# Patient Record
Sex: Female | Born: 1945 | Race: White | Hispanic: No | Marital: Married | Smoking: Former smoker
Health system: Southern US, Community
[De-identification: ages and names within clinical notes are randomized; demographics above are authoritative.]

## PROBLEM LIST (undated history)

## (undated) DIAGNOSIS — F0390 Unspecified dementia without behavioral disturbance: Secondary | ICD-10-CM

## (undated) DIAGNOSIS — E119 Type 2 diabetes mellitus without complications: Secondary | ICD-10-CM

## (undated) DIAGNOSIS — N3000 Acute cystitis without hematuria: Secondary | ICD-10-CM

## (undated) DIAGNOSIS — E785 Hyperlipidemia, unspecified: Secondary | ICD-10-CM

## (undated) DIAGNOSIS — J90 Pleural effusion, not elsewhere classified: Secondary | ICD-10-CM

## (undated) DIAGNOSIS — M810 Age-related osteoporosis without current pathological fracture: Secondary | ICD-10-CM

## (undated) DIAGNOSIS — R0603 Acute respiratory distress: Secondary | ICD-10-CM

## (undated) DIAGNOSIS — I10 Essential (primary) hypertension: Secondary | ICD-10-CM

## (undated) DIAGNOSIS — J9601 Acute respiratory failure with hypoxia: Secondary | ICD-10-CM

## (undated) HISTORY — PX: ABDOMINAL HYSTERECTOMY: SHX81

## (undated) HISTORY — DX: Acute respiratory failure with hypoxia: J96.01

## (undated) HISTORY — DX: Acute cystitis without hematuria: N30.00

## (undated) HISTORY — DX: Acute respiratory distress: R06.03

## (undated) HISTORY — DX: Pleural effusion, not elsewhere classified: J90

---

## 2012-07-11 DIAGNOSIS — R609 Edema, unspecified: Secondary | ICD-10-CM

## 2012-07-11 DIAGNOSIS — E876 Hypokalemia: Secondary | ICD-10-CM

## 2012-07-11 DIAGNOSIS — I10 Essential (primary) hypertension: Secondary | ICD-10-CM

## 2012-07-11 DIAGNOSIS — Z90710 Acquired absence of both cervix and uterus: Secondary | ICD-10-CM

## 2012-07-11 DIAGNOSIS — R519 Headache, unspecified: Secondary | ICD-10-CM

## 2012-07-11 DIAGNOSIS — R32 Unspecified urinary incontinence: Secondary | ICD-10-CM

## 2012-07-11 DIAGNOSIS — E785 Hyperlipidemia, unspecified: Secondary | ICD-10-CM

## 2012-07-11 DIAGNOSIS — K219 Gastro-esophageal reflux disease without esophagitis: Secondary | ICD-10-CM

## 2012-07-11 DIAGNOSIS — F419 Anxiety disorder, unspecified: Secondary | ICD-10-CM

## 2012-07-11 HISTORY — DX: Hyperlipidemia, unspecified: E78.5

## 2012-07-11 HISTORY — DX: Acquired absence of both cervix and uterus: Z90.710

## 2012-07-11 HISTORY — DX: Headache, unspecified: R51.9

## 2012-07-11 HISTORY — DX: Hypokalemia: E87.6

## 2012-07-11 HISTORY — DX: Anxiety disorder, unspecified: F41.9

## 2012-07-11 HISTORY — DX: Edema, unspecified: R60.9

## 2012-07-11 HISTORY — DX: Gastro-esophageal reflux disease without esophagitis: K21.9

## 2012-07-11 HISTORY — DX: Unspecified urinary incontinence: R32

## 2012-07-11 HISTORY — DX: Essential (primary) hypertension: I10

## 2013-03-03 DIAGNOSIS — R634 Abnormal weight loss: Secondary | ICD-10-CM

## 2013-03-03 HISTORY — DX: Abnormal weight loss: R63.4

## 2014-02-25 DIAGNOSIS — M549 Dorsalgia, unspecified: Secondary | ICD-10-CM

## 2014-02-25 HISTORY — DX: Dorsalgia, unspecified: M54.9

## 2015-03-25 DIAGNOSIS — Z79899 Other long term (current) drug therapy: Secondary | ICD-10-CM

## 2015-03-25 HISTORY — DX: Other long term (current) drug therapy: Z79.899

## 2016-02-23 DIAGNOSIS — G2581 Restless legs syndrome: Secondary | ICD-10-CM | POA: Insufficient documentation

## 2016-02-23 HISTORY — DX: Restless legs syndrome: G25.81

## 2016-04-18 DIAGNOSIS — E119 Type 2 diabetes mellitus without complications: Secondary | ICD-10-CM

## 2016-04-18 HISTORY — DX: Type 2 diabetes mellitus without complications: E11.9

## 2019-09-25 DIAGNOSIS — N390 Urinary tract infection, site not specified: Secondary | ICD-10-CM | POA: Diagnosis not present

## 2019-09-25 DIAGNOSIS — N179 Acute kidney failure, unspecified: Secondary | ICD-10-CM | POA: Diagnosis not present

## 2019-09-26 DIAGNOSIS — N179 Acute kidney failure, unspecified: Secondary | ICD-10-CM | POA: Diagnosis not present

## 2019-09-26 DIAGNOSIS — N390 Urinary tract infection, site not specified: Secondary | ICD-10-CM | POA: Diagnosis not present

## 2019-09-26 DIAGNOSIS — I340 Nonrheumatic mitral (valve) insufficiency: Secondary | ICD-10-CM

## 2019-09-26 DIAGNOSIS — I361 Nonrheumatic tricuspid (valve) insufficiency: Secondary | ICD-10-CM

## 2019-09-27 DIAGNOSIS — N390 Urinary tract infection, site not specified: Secondary | ICD-10-CM | POA: Diagnosis not present

## 2019-09-27 DIAGNOSIS — N179 Acute kidney failure, unspecified: Secondary | ICD-10-CM | POA: Diagnosis not present

## 2019-09-28 ENCOUNTER — Encounter (HOSPITAL_COMMUNITY): Payer: Self-pay | Admitting: Physician Assistant

## 2019-09-28 ENCOUNTER — Inpatient Hospital Stay (HOSPITAL_COMMUNITY): Payer: Medicare Other

## 2019-09-28 ENCOUNTER — Inpatient Hospital Stay (HOSPITAL_COMMUNITY)
Admission: AD | Admit: 2019-09-28 | Discharge: 2019-10-06 | DRG: 682 | Disposition: A | Discharge status: Home Health | Payer: Medicare Other | Source: Other Acute Inpatient Hospital | Attending: Internal Medicine | Admitting: Internal Medicine

## 2019-09-28 ENCOUNTER — Other Ambulatory Visit: Payer: Self-pay

## 2019-09-28 DIAGNOSIS — E785 Hyperlipidemia, unspecified: Secondary | ICD-10-CM | POA: Diagnosis present

## 2019-09-28 DIAGNOSIS — I471 Supraventricular tachycardia: Secondary | ICD-10-CM | POA: Diagnosis not present

## 2019-09-28 DIAGNOSIS — M810 Age-related osteoporosis without current pathological fracture: Secondary | ICD-10-CM | POA: Diagnosis present

## 2019-09-28 DIAGNOSIS — G20 Parkinson's disease: Secondary | ICD-10-CM | POA: Diagnosis not present

## 2019-09-28 DIAGNOSIS — R0600 Dyspnea, unspecified: Secondary | ICD-10-CM

## 2019-09-28 DIAGNOSIS — I4891 Unspecified atrial fibrillation: Secondary | ICD-10-CM | POA: Diagnosis not present

## 2019-09-28 DIAGNOSIS — Z8041 Family history of malignant neoplasm of ovary: Secondary | ICD-10-CM

## 2019-09-28 DIAGNOSIS — I10 Essential (primary) hypertension: Secondary | ICD-10-CM | POA: Diagnosis present

## 2019-09-28 DIAGNOSIS — S42034A Nondisplaced fracture of lateral end of right clavicle, initial encounter for closed fracture: Secondary | ICD-10-CM | POA: Diagnosis present

## 2019-09-28 DIAGNOSIS — Z23 Encounter for immunization: Secondary | ICD-10-CM

## 2019-09-28 DIAGNOSIS — N179 Acute kidney failure, unspecified: Secondary | ICD-10-CM

## 2019-09-28 DIAGNOSIS — J90 Pleural effusion, not elsewhere classified: Secondary | ICD-10-CM

## 2019-09-28 DIAGNOSIS — Z8349 Family history of other endocrine, nutritional and metabolic diseases: Secondary | ICD-10-CM

## 2019-09-28 DIAGNOSIS — E860 Dehydration: Secondary | ICD-10-CM | POA: Diagnosis present

## 2019-09-28 DIAGNOSIS — D638 Anemia in other chronic diseases classified elsewhere: Secondary | ICD-10-CM | POA: Diagnosis present

## 2019-09-28 DIAGNOSIS — J918 Pleural effusion in other conditions classified elsewhere: Secondary | ICD-10-CM | POA: Diagnosis present

## 2019-09-28 DIAGNOSIS — F0390 Unspecified dementia without behavioral disturbance: Secondary | ICD-10-CM | POA: Diagnosis present

## 2019-09-28 DIAGNOSIS — E8779 Other fluid overload: Secondary | ICD-10-CM | POA: Diagnosis present

## 2019-09-28 DIAGNOSIS — N3000 Acute cystitis without hematuria: Secondary | ICD-10-CM

## 2019-09-28 DIAGNOSIS — F329 Major depressive disorder, single episode, unspecified: Secondary | ICD-10-CM | POA: Diagnosis present

## 2019-09-28 DIAGNOSIS — Z87891 Personal history of nicotine dependence: Secondary | ICD-10-CM

## 2019-09-28 DIAGNOSIS — T368X5A Adverse effect of other systemic antibiotics, initial encounter: Secondary | ICD-10-CM | POA: Diagnosis present

## 2019-09-28 DIAGNOSIS — T464X5A Adverse effect of angiotensin-converting-enzyme inhibitors, initial encounter: Secondary | ICD-10-CM | POA: Diagnosis present

## 2019-09-28 DIAGNOSIS — Z79899 Other long term (current) drug therapy: Secondary | ICD-10-CM

## 2019-09-28 DIAGNOSIS — E1165 Type 2 diabetes mellitus with hyperglycemia: Secondary | ICD-10-CM | POA: Diagnosis present

## 2019-09-28 DIAGNOSIS — E119 Type 2 diabetes mellitus without complications: Secondary | ICD-10-CM | POA: Diagnosis not present

## 2019-09-28 DIAGNOSIS — Z7901 Long term (current) use of anticoagulants: Secondary | ICD-10-CM | POA: Diagnosis not present

## 2019-09-28 DIAGNOSIS — E876 Hypokalemia: Secondary | ICD-10-CM | POA: Diagnosis present

## 2019-09-28 DIAGNOSIS — W19XXXA Unspecified fall, initial encounter: Secondary | ICD-10-CM | POA: Diagnosis present

## 2019-09-28 DIAGNOSIS — I480 Paroxysmal atrial fibrillation: Secondary | ICD-10-CM | POA: Diagnosis present

## 2019-09-28 DIAGNOSIS — F0281 Dementia in other diseases classified elsewhere with behavioral disturbance: Secondary | ICD-10-CM | POA: Diagnosis not present

## 2019-09-28 DIAGNOSIS — R0603 Acute respiratory distress: Secondary | ICD-10-CM

## 2019-09-28 DIAGNOSIS — Z801 Family history of malignant neoplasm of trachea, bronchus and lung: Secondary | ICD-10-CM

## 2019-09-28 DIAGNOSIS — R197 Diarrhea, unspecified: Secondary | ICD-10-CM | POA: Diagnosis present

## 2019-09-28 DIAGNOSIS — Z806 Family history of leukemia: Secondary | ICD-10-CM

## 2019-09-28 DIAGNOSIS — N170 Acute kidney failure with tubular necrosis: Secondary | ICD-10-CM | POA: Diagnosis present

## 2019-09-28 DIAGNOSIS — Z820 Family history of epilepsy and other diseases of the nervous system: Secondary | ICD-10-CM

## 2019-09-28 DIAGNOSIS — J9601 Acute respiratory failure with hypoxia: Secondary | ICD-10-CM | POA: Diagnosis present

## 2019-09-28 HISTORY — DX: Unspecified dementia, unspecified severity, without behavioral disturbance, psychotic disturbance, mood disturbance, and anxiety: F03.90

## 2019-09-28 HISTORY — DX: Essential (primary) hypertension: I10

## 2019-09-28 HISTORY — DX: Age-related osteoporosis without current pathological fracture: M81.0

## 2019-09-28 HISTORY — DX: Hyperlipidemia, unspecified: E78.5

## 2019-09-28 HISTORY — DX: Type 2 diabetes mellitus without complications: E11.9

## 2019-09-28 HISTORY — DX: Acute kidney failure, unspecified: N17.9

## 2019-09-28 LAB — CBC WITH DIFFERENTIAL/PLATELET
Abs Immature Granulocytes: 0.09 10*3/uL — ABNORMAL HIGH (ref 0.00–0.07)
Basophils Absolute: 0 10*3/uL (ref 0.0–0.1)
Basophils Relative: 0 %
Eosinophils Absolute: 0.2 10*3/uL (ref 0.0–0.5)
Eosinophils Relative: 1 %
HCT: 39.7 % (ref 36.0–46.0)
Hemoglobin: 12.7 g/dL (ref 12.0–15.0)
Immature Granulocytes: 1 %
Lymphocytes Relative: 7 %
Lymphs Abs: 1 10*3/uL (ref 0.7–4.0)
MCH: 31.1 pg (ref 26.0–34.0)
MCHC: 32 g/dL (ref 30.0–36.0)
MCV: 97.3 fL (ref 80.0–100.0)
Monocytes Absolute: 1.1 10*3/uL — ABNORMAL HIGH (ref 0.1–1.0)
Monocytes Relative: 7 %
Neutro Abs: 12.9 10*3/uL — ABNORMAL HIGH (ref 1.7–7.7)
Neutrophils Relative %: 84 %
Platelets: 186 10*3/uL (ref 150–400)
RBC: 4.08 MIL/uL (ref 3.87–5.11)
RDW: 15.9 % — ABNORMAL HIGH (ref 11.5–15.5)
WBC: 15.3 10*3/uL — ABNORMAL HIGH (ref 4.0–10.5)
nRBC: 0 % (ref 0.0–0.2)

## 2019-09-28 LAB — COMPREHENSIVE METABOLIC PANEL
ALT: 16 U/L (ref 0–44)
AST: 19 U/L (ref 15–41)
Albumin: 2.2 g/dL — ABNORMAL LOW (ref 3.5–5.0)
Alkaline Phosphatase: 75 U/L (ref 38–126)
Anion gap: 13 (ref 5–15)
BUN: 42 mg/dL — ABNORMAL HIGH (ref 8–23)
CO2: 23 mmol/L (ref 22–32)
Calcium: 7.8 mg/dL — ABNORMAL LOW (ref 8.9–10.3)
Chloride: 105 mmol/L (ref 98–111)
Creatinine, Ser: 4.85 mg/dL — ABNORMAL HIGH (ref 0.44–1.00)
GFR calc Af Amer: 10 mL/min — ABNORMAL LOW (ref >60)
GFR calc non Af Amer: 8 mL/min — ABNORMAL LOW (ref >60)
Glucose, Bld: 148 mg/dL — ABNORMAL HIGH (ref 70–99)
Potassium: 3.4 mmol/L — ABNORMAL LOW (ref 3.5–5.1)
Sodium: 141 mmol/L (ref 135–145)
Total Bilirubin: 0.5 mg/dL (ref 0.3–1.2)
Total Protein: 5 g/dL — ABNORMAL LOW (ref 6.5–8.1)

## 2019-09-28 LAB — POCT I-STAT 7, (LYTES, BLD GAS, ICA,H+H)
Acid-base deficit: 2 mmol/L (ref 0.0–2.0)
Bicarbonate: 23.8 mmol/L (ref 20.0–28.0)
Calcium, Ion: 1.12 mmol/L — ABNORMAL LOW (ref 1.15–1.40)
HCT: 35 % — ABNORMAL LOW (ref 36.0–46.0)
Hemoglobin: 11.9 g/dL — ABNORMAL LOW (ref 12.0–15.0)
O2 Saturation: 98 %
Patient temperature: 99.7
Potassium: 3.2 mmol/L — ABNORMAL LOW (ref 3.5–5.1)
Sodium: 140 mmol/L (ref 135–145)
TCO2: 25 mmol/L (ref 22–32)
pCO2 arterial: 47.5 mmHg (ref 32.0–48.0)
pH, Arterial: 7.311 — ABNORMAL LOW (ref 7.350–7.450)
pO2, Arterial: 128 mmHg — ABNORMAL HIGH (ref 83.0–108.0)

## 2019-09-28 LAB — RESPIRATORY PANEL BY PCR

## 2019-09-28 LAB — URINALYSIS, ROUTINE W REFLEX MICROSCOPIC
Bacteria, UA: NONE SEEN
Bilirubin Urine: NEGATIVE
Glucose, UA: NEGATIVE mg/dL
Ketones, ur: NEGATIVE mg/dL
Leukocytes,Ua: NEGATIVE
Nitrite: NEGATIVE
Protein, ur: NEGATIVE mg/dL
Specific Gravity, Urine: 1.006 (ref 1.005–1.030)
pH: 5 (ref 5.0–8.0)

## 2019-09-28 LAB — HEMOGLOBIN A1C
Hgb A1c MFr Bld: 5.8 % — ABNORMAL HIGH (ref 4.8–5.6)
Mean Plasma Glucose: 119.76 mg/dL

## 2019-09-28 LAB — GLUCOSE, CAPILLARY
Glucose-Capillary: 129 mg/dL — ABNORMAL HIGH (ref 70–99)
Glucose-Capillary: 180 mg/dL — ABNORMAL HIGH (ref 70–99)
Glucose-Capillary: 258 mg/dL — ABNORMAL HIGH (ref 70–99)
Glucose-Capillary: 211 mg/dL — ABNORMAL HIGH (ref 70–99)
Glucose-Capillary: 196 mg/dL — ABNORMAL HIGH (ref 70–99)

## 2019-09-28 LAB — LACTIC ACID, PLASMA
Lactic Acid, Venous: 0.9 mmol/L (ref 0.5–1.9)
Lactic Acid, Venous: 0.9 mmol/L (ref 0.5–1.9)

## 2019-09-28 LAB — SODIUM, URINE, RANDOM: Sodium, Ur: 114 mmol/L

## 2019-09-28 LAB — PROCALCITONIN: Procalcitonin: 0.32 ng/mL

## 2019-09-28 LAB — PHOSPHORUS: Phosphorus: 4.1 mg/dL (ref 2.5–4.6)

## 2019-09-28 LAB — MRSA PCR SCREENING: MRSA by PCR: NEGATIVE

## 2019-09-28 LAB — MAGNESIUM: Magnesium: 1.7 mg/dL (ref 1.7–2.4)

## 2019-09-28 LAB — CREATININE, URINE, RANDOM: Creatinine, Urine: 23.9 mg/dL

## 2019-09-28 MED ORDER — INSULIN ASPART 100 UNIT/ML ~~LOC~~ SOLN
0.0000 [IU] | Freq: Three times a day (TID) | SUBCUTANEOUS | Status: DC
  Administered 2019-09-28: 3 [IU] via SUBCUTANEOUS
  Administered 2019-09-28: 13:00:00 5 [IU] via SUBCUTANEOUS
  Administered 2019-09-29 (×3): 3 [IU] via SUBCUTANEOUS
  Administered 2019-09-30: 2 [IU] via SUBCUTANEOUS
  Administered 2019-09-30: 3 [IU] via SUBCUTANEOUS
  Administered 2019-09-30 – 2019-10-01 (×4): 2 [IU] via SUBCUTANEOUS
  Administered 2019-10-02: 1 [IU] via SUBCUTANEOUS
  Administered 2019-10-02 (×2): 2 [IU] via SUBCUTANEOUS
  Administered 2019-10-03: 3 [IU] via SUBCUTANEOUS
  Administered 2019-10-03: 2 [IU] via SUBCUTANEOUS
  Administered 2019-10-03: 3 [IU] via SUBCUTANEOUS
  Administered 2019-10-04: 5 [IU] via SUBCUTANEOUS
  Administered 2019-10-04: 2 [IU] via SUBCUTANEOUS
  Administered 2019-10-04: 3 [IU] via SUBCUTANEOUS
  Administered 2019-10-05 (×3): 2 [IU] via SUBCUTANEOUS
  Administered 2019-10-06: 3 [IU] via SUBCUTANEOUS

## 2019-09-28 MED ORDER — INFLUENZA VAC A&B SA ADJ QUAD 0.5 ML IM PRSY
0.5000 mL | PREFILLED_SYRINGE | INTRAMUSCULAR | Status: AC
  Administered 2019-09-29: 0.5 mL via INTRAMUSCULAR
  Filled 2019-09-28: qty 0.5

## 2019-09-28 MED ORDER — PAROXETINE HCL 20 MG PO TABS
20.0000 mg | ORAL_TABLET | Freq: Every day | ORAL | Status: DC
  Administered 2019-09-28 – 2019-10-06 (×9): 20 mg via ORAL
  Filled 2019-09-28 (×9): qty 1

## 2019-09-28 MED ORDER — INSULIN ASPART 100 UNIT/ML ~~LOC~~ SOLN
0.0000 [IU] | Freq: Every day | SUBCUTANEOUS | Status: DC
  Administered 2019-10-04: 23:00:00 2 [IU] via SUBCUTANEOUS

## 2019-09-28 MED ORDER — SODIUM CHLORIDE 0.9 % IV SOLN: 2.0000 g | INTRAVENOUS | Status: DC

## 2019-09-28 MED ORDER — MAGNESIUM SULFATE IN D5W 1-5 GM/100ML-% IV SOLN
1.0000 g | Freq: Once | INTRAVENOUS | Status: AC
  Administered 2019-09-28: 1 g via INTRAVENOUS
  Filled 2019-09-28: qty 100

## 2019-09-28 MED ORDER — HEPARIN SODIUM (PORCINE) 5000 UNIT/ML IJ SOLN
5000.0000 [IU] | Freq: Three times a day (TID) | INTRAMUSCULAR | Status: DC
  Administered 2019-09-28 – 2019-09-30 (×6): 5000 [IU] via SUBCUTANEOUS
  Filled 2019-09-28 (×6): qty 1

## 2019-09-28 MED ORDER — AMIODARONE HCL IN DEXTROSE 360-4.14 MG/200ML-% IV SOLN
60.0000 mg/h | INTRAVENOUS | Status: AC
  Administered 2019-09-28 (×2): 60 mg/h via INTRAVENOUS

## 2019-09-28 MED ORDER — SODIUM CHLORIDE 0.9 % IV SOLN
1.0000 g | INTRAVENOUS | Status: DC
  Filled 2019-09-28: qty 10

## 2019-09-28 MED ORDER — STERILE WATER FOR INJECTION IV SOLN
INTRAVENOUS | Status: DC
  Administered 2019-09-28: 06:00:00 via INTRAVENOUS
  Filled 2019-09-28: qty 850

## 2019-09-28 MED ORDER — AMIODARONE LOAD VIA INFUSION
150.0000 mg | Freq: Once | INTRAVENOUS | Status: AC
  Administered 2019-09-28: 11:00:00 150 mg via INTRAVENOUS
  Filled 2019-09-28: qty 83.34

## 2019-09-28 MED ORDER — AMIODARONE HCL IN DEXTROSE 360-4.14 MG/200ML-% IV SOLN
30.0000 mg/h | INTRAVENOUS | Status: DC
  Administered 2019-09-28 – 2019-09-29 (×2): 30 mg/h via INTRAVENOUS
  Filled 2019-09-28 (×4): qty 200

## 2019-09-28 MED ORDER — POTASSIUM CHLORIDE 20 MEQ PO PACK
40.0000 meq | PACK | Freq: Once | ORAL | Status: AC
  Administered 2019-09-28: 40 meq via ORAL
  Filled 2019-09-28: qty 2

## 2019-09-28 MED ORDER — FUROSEMIDE 10 MG/ML IJ SOLN
100.0000 mg | Freq: Once | INTRAVENOUS | Status: AC
  Administered 2019-09-28: 100 mg via INTRAVENOUS
  Filled 2019-09-28: qty 10

## 2019-09-28 MED ORDER — CHLORHEXIDINE GLUCONATE CLOTH 2 % EX PADS
6.0000 | MEDICATED_PAD | Freq: Every day | CUTANEOUS | Status: DC
  Administered 2019-09-28 – 2019-10-06 (×8): 6 via TOPICAL

## 2019-09-28 MED ORDER — CLONAZEPAM 0.5 MG PO TABS
2.0000 mg | ORAL_TABLET | Freq: Every day | ORAL | Status: DC | PRN
  Administered 2019-10-03 – 2019-10-05 (×2): 2 mg via ORAL
  Filled 2019-09-28 (×2): qty 4

## 2019-09-28 MED ORDER — VANCOMYCIN HCL 10 G IV SOLR
1250.0000 mg | Freq: Once | INTRAVENOUS | Status: AC
  Administered 2019-09-28: 06:00:00 1250 mg via INTRAVENOUS
  Filled 2019-09-28: qty 1250

## 2019-09-28 MED ORDER — VANCOMYCIN VARIABLE DOSE PER UNSTABLE RENAL FUNCTION (PHARMACIST DOSING): Status: DC

## 2019-09-28 MED ORDER — METOPROLOL TARTRATE 5 MG/5ML IV SOLN
5.0000 mg | Freq: Four times a day (QID) | INTRAVENOUS | Status: DC
  Administered 2019-09-28 – 2019-09-29 (×4): 5 mg via INTRAVENOUS
  Filled 2019-09-28 (×4): qty 5

## 2019-09-28 NOTE — Consult Note (Signed)
Renal Service Consult Note [facility]  Haifa Stigler 09/28/2019 Sol Blazing Requesting Physician:  Dr Lake Bells  Reason for Consult:  AKI HPI: The patient is a 73 y.o. year-old with hx of HTN, HL, DM2, dementia who was dx'd with UTI and started on Bactrim on 11/9, then became fatigued, malaise, gen weakness, poor po intake over the next few days. She presented to [facility] on 11/12 where labs showed AKI w/ creat 4.0 (baseline <1.0).  Pt was admitted and started on Zosyn . Then had increasing O2 demand w/ L pleural effusion. Thoracentesis returned 650 cc. V/Q scan was low prob, echo showed EF 60-65%, noncon CT of  abd/ pelvis was negative reportedly for renal obstruction.  O2 demand worsened to 10L [place] and pt was tx'd to  [facility] ICU today. Since arrival is feeling better and has voided 2.4 L w/ foley cath in place today since arriving at 1 am. Asked to see for renal failure.     Patient is very poor historian, hx gained from the chart.  Pt denies CP, abd pain, n/v/d.  No sig SOB or coughing.    ROS  denies CP  no joint pain   no HA  no blurry vision  no rash  no diarrhea  no nausea/ vomiting   Past Medical History  Past Medical History:  Diagnosis Date  . Dementia ([place])   . Diabetes ([place])   . Hyperlipidemia   . Hypertension   . Osteoporosis    Past Surgical History History reviewed. No pertinent surgical history. Family History No family history on file. Social History  has no history on file for tobacco, alcohol, and drug. Allergies  Allergies  Allergen Reactions  . Metformin And Related Diarrhea   Home medications Prior to Admission medications   Medication Sig Start Date End Date Taking? Authorizing Provider  amLODipine (NORVASC) 10 MG tablet Take 10 mg by mouth daily. 10/25/15   [provider]  clonazePAM (KLONOPIN) 2 MG tablet Take 2 mg by mouth.  10/19/15   [provider]  glipiZIDE (GLUCOTROL) 5 MG tablet Take 5 mg by mouth 2 (two)  times daily. 08/13/19 08/13/21  [provider]  hydrochlorothiazide (HYDRODIURIL) 25 MG tablet Take 25 mg by mouth.  10/25/15   [provider]  lisinopril (ZESTRIL) 40 MG tablet Take 40 mg by mouth daily. 10/25/15   [provider]  lovastatin (MEVACOR) 20 MG tablet Take 20 mg by mouth daily. 10/25/15   [provider]  metoprolol tartrate (LOPRESSOR) 100 MG tablet Take 100 mg by mouth.  06/30/14   [provider]  oxybutynin (DITROPAN-XL) 10 MG 24 hr tablet Take 10 mg by mouth daily.  09/19/19   [provider]  PARoxetine (PAXIL) 20 MG tablet Take 20 mg by mouth daily. 10/25/15   [provider]  phenazopyridine (PYRIDIUM) 200 MG tablet Take 200 mg by mouth 3 (three) times daily. 09/22/19 09/21/20  [provider]  pioglitazone (ACTOS) 30 MG tablet Take 30 mg by mouth daily. 08/20/19   [provider]  potassium chloride (KLOR-CON) 10 MEQ tablet Take 10 mEq by mouth daily. 09/08/19   [provider]  sulfamethoxazole-trimethoprim (BACTRIM DS) 800-160 MG tablet Take 1 tablet by mouth 2 (two) times daily. 09/22/19   [provider]  venlafaxine XR (EFFEXOR-XR) 75 MG 24 hr capsule Take 75 mg by mouth daily. 02/10/19   [provider]   Liver Function Tests Recent Labs  Lab 09/28/19 [id number]  AST 19  ALT 16  ALKPHOS 75  BILITOT 0.5  PROT 5.0*  ALBUMIN 2.2*   No results for input(s): LIPASE, AMYLASE in the last 168 hours. CBC Recent Labs  Lab 09/28/19 0335 09/28/19 0356  WBC  --  15.3*  NEUTROABS  --  12.9*  HGB 11.9* 12.7  HCT 35.0* 39.7  MCV  --  97.3  PLT  --  99991111   Basic Metabolic Panel Recent Labs  Lab 09/28/19 0335 09/28/19 0356  NA 140 141  K 3.2* 3.4*  CL  --  105  CO2  --  23  GLUCOSE  --  148*  BUN  --  42*  CREATININE  --  4.85*  CALCIUM  --  7.8*  PHOS  --  4.1   Iron/TIBC/Ferritin/ %Sat No results found for: IRON, TIBC, FERRITIN, IRONPCTSAT  Vitals:    09/28/19 1630 09/28/19 1700 09/28/19 1730 09/28/19 1800  BP: (!) 107/51 (!) 114/48 (!) 135/49 (!) 131/55  Pulse: 77 68 80 75  Resp: 19 18 19 19   Temp:      TempSrc:      SpO2: 90% 91% 90% 91%  Weight:      Height:        Exam Gen elderly adult female, HOH, no distress, calm , Ox 3 No rash, cyanosis or gangrene Sclera anicteric, throat clear  No jvd or bruits, JVP about 8-10 cm Chest clear on R, L base w/ bronchial BS RRR no MRG Abd soft ntnd no mass or ascites +bs no hsm GU foley in place draining clear yellow urine MS no joint effusions or deformity Ext doughy 1-2 mild dependent lateral hip edema, no lower leg edema or UE edema Neuro is alert, Ox 3 , nf, no asterixis    Home meds:  - amlodipine 10/ hydrochlorothiazide 25 qd/ lisinopril 40 qd/ metoprolol 100 qd /Kdur 10 qd  - pioglitazone 30 qd/ glipizide 5 bid  - clonazepam 2mg  prn/ paroxetine 20 qd/ venlafaxine xr 75 qd  - bactrim DS bid  - lovastatin 20 qd  - prn's/ vitamins/ supplements    Today UA here is negative, UNa 114 and UCr 23.9   Na 141  K 3.4  CO2 323  BUN 42  Creat 4.85.      Renal US 11/15 > 11 cm kidney's w/o hydro, normal echo, 9 mm echogenic lesion mid-lower right c/w prev idenitified small AML    CXR 11/15 > large L effusion, vasc congestion, mild CM  Assessment/ Plan: 1. AKI - in patient who was getting Bactrim for UTI while on lisinopril for HTN, presented on 11/12 w/ creat 4.0 to outside hospital. Question vol overload as well, so sent to [facility] on 11/15 where creat remains about 4.0.  Here the pt had normal BP's, moderate edema LE's, L effusion by exam/ CXR, not in distress. Got IV lasix 100 cc and diuresed 2.4 L so far in 18 hours.  AKI due to acei/ dehydration/ bactrim causing ATN most likely.  UA w/o sig proteinuria or hematuria to suggest glomerular disease. Vol overload sig but not severe, pt diuresed very well here since arrival w/ one dose of IV lasix 100mg . Will hold further lasix for tonight and  reassess creat in am, redose as needed.   2. Volume overload - L effusion, LE edema, as above. ECHO at outside showed normal LVEF.  3. Atrial fib - on IV amiodarone, in and out of afib here 4. Recent UTI - UA here  negative 5. IDDM 6. Dementia - seems pretty oriented here 7. Depression 8. HL      Kelly Splinter  MD 09/28/2019, 6:31 PM

## 2019-09-28 NOTE — Progress Notes (Signed)
Pharmacy Antibiotic Note  Summer Goodman is a 73 y.o. female admitted on 09/28/2019 with pneumonia and sepsis.  Pharmacy has been consulted for vancomycin and cefepime dosing.  Pt had initially been treated with Bactrim as an outpatient for UTI, changed to [place] after admission to [facility]; Zosyn was still running when pt arrived to [facility] ICU.  Pt w/ AKI, baseline SCr 0.8, currently 5.6.  Labs from OSH: WBC 13.2, Hgb, 12.4, Plt 180, pH 7.29  Home meds: Norvasc, Klonopin, Ascomp w/ codeine, enalapril, HCTZ, lisinopril, Miralax, lovastatin, Lopressor, Zofran, KCl  Plan: Vancomycin 1250mg  IV x1; monitor renal function prior to redosing. Cefepime 2gIV Q24H.  Height: 5' (152.4 cm) Weight: 151 lb 10.8 oz (68.8 kg) IBW/kg (Calculated) : 45.5  Temp (24hrs), Avg:99.1 F (37.3 C), Min:98.7 F (37.1 C), Max:99.5 F (37.5 C)   Allergies  Allergen Reactions  . Metformin And Related Diarrhea    Thank you for allowing pharmacy to be a part of this patient's care.  Wynona Neat, PharmD, BCPS  09/28/2019 4:31 AM

## 2019-09-28 NOTE — Progress Notes (Signed)
  Amiodarone Drug - Drug Interaction Consult Note  Recommendations: Continue current therapy  Amiodarone is metabolized by the cytochrome P450 system and therefore has the potential to cause many drug interactions. Amiodarone has an average plasma half-life of 50 days (range 20 to 100 days).   There is potential for drug interactions to occur several weeks or months after stopping treatment and the onset of drug interactions may be slow after initiating amiodarone.   []  Statins: Increased risk of myopathy. Simvastatin- restrict dose to 20mg  daily. Other statins: counsel patients to report any muscle pain or weakness immediately.  []  Anticoagulants: Amiodarone can increase anticoagulant effect. Consider warfarin dose reduction. Patients should be monitored closely and the dose of anticoagulant altered accordingly, remembering that amiodarone levels take several weeks to stabilize.  []  Antiepileptics: Amiodarone can increase plasma concentration of phenytoin, the dose should be reduced. Note that small changes in phenytoin dose can result in large changes in levels. Monitor patient and counsel on signs of toxicity.  [x]  Beta blockers: increased risk of bradycardia, AV block and myocardial depression. Sotalol - avoid concomitant use.  []   Calcium channel blockers (diltiazem and verapamil): increased risk of bradycardia, AV block and myocardial depression.  []   Cyclosporine: Amiodarone increases levels of cyclosporine. Reduced dose of cyclosporine is recommended.  []  Digoxin dose should be halved when amiodarone is started.  []  Diuretics: increased risk of cardiotoxicity if hypokalemia occurs.  []  Oral hypoglycemic agents (glyburide, glipizide, glimepiride): increased risk of hypoglycemia. Patient's glucose levels should be monitored closely when initiating amiodarone therapy.   []  Drugs that prolong the QT interval:  Torsades de pointes risk may be increased with concurrent use - avoid if  possible.  Monitor QTc, also keep magnesium/potassium WNL if concurrent therapy can't be avoided. Marland Kitchen Antibiotics: e.g. fluoroquinolones, erythromycin. . Antiarrhythmics: e.g. quinidine, procainamide, disopyramide, sotalol. . Antipsychotics: e.g. phenothiazines, haloperidol.  . Lithium, tricyclic antidepressants, and methadone.  Thank You,   Vertis Kelch, PharmD PGY2 Cardiology Pharmacy Resident Phone [phone] 09/28/2019       10:47 AM

## 2019-09-28 NOTE — H&P (Signed)
NAME:  Summer Goodman, MRN:  [id number], DOB:  [birth year], LOS: 0 ADMISSION DATE:  09/28/2019, CONSULTATION DATE:  09/28/19  CHIEF COMPLAINT:  Acute Renal Failure  Brief History   73 y/o F with recent UTI on Bactrim admitted to [facility] with AKI.  Creatinine worsening with hypoxic respiratory failure and pleural effusion, so was transferred to [facility] for intensive care.   History of present illness   Summer Goodman is a 73 y.o. F with PMH of Dementia, HTN, HL, Type 2 DM,  Osteoporosis who was diagnosed with an UTI on 11/9 and started on Bactrim.  Over the next few days she had malaise, weakness, reduced po intake and presented to [facility] on 11/12 where she was found to have an AKI with creatinine of ~4 with a baseline of <1.0, no hyperkalemia.  She was admitted there and started on Zosyn and then had increasing oxygen demand with L pleural effusion.  Thoracentesis was performed with 650cc out.    She has had a V/Q scan with low probability PE, TTE with EF of 60-65%, non-contrast CT chest and CT abd/pelvis that was negative for obstructive uropathy.    Given increasing oxygen demand (10L [place]) and worsening renal failure, pt was transferred to [facility] for nephrology consult and critical care.  On arrival, pt is awake and alert, was placed on non-rebreather mask for transport, but able to transition back to [place].  Pt states she is feeling "better".   Pt states she has been urinating a little, 150cc in the foley bag and 100cc out in the first hour after arrival.   Past Medical History   has a past medical history of Dementia ([place]), Diabetes ([place]), Hyperlipidemia, Hypertension, and Osteoporosis.   Significant Hospital Events   11/12>>Admitted to [facility] 11/15>>Transfer to [facility]   Consults:  Nephrology  Procedures:  11/13 Thoracentesis with 650cc yellow pleural fluid  Significant Diagnostic Tests:  11/13 CT abdomen/pelvis>>No acute intra-abdominal process 11/14 CT  chest w/o contrast>>decreased size of L pleural effusion after thora, rounded pleural based opacity in the lingula, underlying mass not excluded  11/14 V/Q scan>> no evidence of PE 11/14 TEE>> EF 60-65%, pseudonormal LV filling pattern , mild tricuspid and mitral regurge 11/15  Micro Data:  11/13 Blood and urine cultures done at [facility], no growth noted in transfer paperwork 11/14 Sars-CoV-2>>negative   Antimicrobials:  Zosyn 11/13-11/15 Ceftriaxone 11/15-  Interim history/subjective:  Pt arrived on bicarb gtt and non-rebreather vital signs stable.   Objective   Blood pressure 113/63, pulse 61, temperature 99.5 F (37.5 C), temperature source Oral, resp. rate 15, height 5' (1.524 m), weight 68.8 kg, SpO2 94 %.        Intake/Output Summary (Last 24 hours) at 09/28/2019 0313 Last data filed at 09/28/2019 0200 Gross per 24 hour  Intake -  Output 150 ml  Net -150 ml   Filed Weights   09/28/19 0239  Weight: 68.8 kg    General:  Elderly F, chronically ill-appearing,  wake and alert and non-toxic appearing HEENT: MM pink/moist Neuro: oriented to person and somewhat to situation, moving all extremities without any focal deficits CV: s1s2 rrr, no m/r/g PULM:  Decreased breath sounds bilateral bases, no respiratory distress or accessory muscle use on  oxygen GI: soft, bsx4 active, non-tender  Extremities: warm/dry, no edema  Skin: no rashes or lesions   Resolved Hospital Problem list     Assessment & Plan:   Non-oliguric Acute Renal Failure -in the setting  of UTI and reduced po intake on ACEI -repeat creatinine slightly improved from most recent in [place] records P: -no hyperkalemia, continue bicarb gtt @75cc /hr, if UOP still minimal then reduce rate -consult nephrology for possible HD -Closely monitor BMP and UOP, replaced K with 52meq pox1 and Mag 1g IVx1  Acute Hypoxic Respiratory Failure with L pleural Effusion -transitioned from non-rebreather mask to HFNC  -CT chest from 11/14 with L rounded pleural based opacity in the lingula, underlying mass not excluded, no imaging CD sent over  P: -Consider repeat Thoracentesis, will try Lasix 100mg  x1 and monitor response -Given increasing O2 requirement and possible opacity on CT chest will treat for HCAP with Vanc and Cefepime, pt will need repeat CT chest as an outpatient to evaluate for possible mass -Check procalcitonin, respiratory culture and RVP    UTI -not severely septic and obstructive uropathy P: -cover with cefepime, UC done at [facility] -continue Foley  Type 2 DM P: -Hold home Glipizide and  Actos -SSI   HTN/HL -Normotensive on arrival P: -hold home Lisinopril, Norvasc, Lopressor, HCTZ and statin for now    Dementia, Depression -continue home Klonopin at reduced dose and Paxil    Best practice:  Diet: diabetic  Pain/Anxiety/Delirium protocol (if indicated): n/a VAP protocol (if indicated): n/a DVT prophylaxis: heparin GI prophylaxis: n/a Glucose control: SSI Mobility: with assist Code Status: Full  Family Communication:  Disposition: ICU  Labs   CBC: No results for input(s): WBC, NEUTROABS, HGB, HCT, MCV, PLT in the last 168 hours.  Basic Metabolic Panel: No results for input(s): NA, K, CL, CO2, GLUCOSE, BUN, CREATININE, CALCIUM, MG, PHOS in the last 168 hours. GFR: CrCl cannot be calculated (No successful lab value found.). No results for input(s): PROCALCITON, WBC, LATICACIDVEN in the last 168 hours.  Liver Function Tests: No results for input(s): AST, ALT, ALKPHOS, BILITOT, PROT, ALBUMIN in the last 168 hours. No results for input(s): LIPASE, AMYLASE in the last 168 hours. No results for input(s): AMMONIA in the last 168 hours.  ABG No results found for: PHART, PCO2ART, PO2ART, HCO3, TCO2, ACIDBASEDEF, O2SAT   Coagulation Profile: No results for input(s): INR, PROTIME in the last 168 hours.  Cardiac Enzymes: No results for input(s): CKTOTAL, CKMB,  CKMBINDEX, TROPONINI in the last 168 hours.  HbA1C: No results found for: HGBA1C  CBG: Recent Labs  Lab 09/28/19 0236  GLUCAP 129*    Review of Systems:   Negative except as noted in HPI  Past Medical History  She,  has no past medical history on file.   Surgical History     Social History    Lives with husband  Family History     Allergies No Known Allergies   Home Medications     Not on File     Critical care time: 60 minutes    CRITICAL CARE Performed by: Otilio Carpen Elvy Mclarty   Total critical care time: 60 minutes  Critical care time was exclusive of separately billable procedures and treating other patients.  Critical care was necessary to treat or prevent imminent or life-threatening deterioration.  Critical care was time spent personally by me on the following activities: development of treatment plan with patient and/or surrogate as well as nursing, discussions with consultants, evaluation of patient's response to treatment, examination of patient, obtaining history from patient or surrogate, ordering and performing treatments and interventions, ordering and review of laboratory studies, ordering and review of radiographic studies, pulse oximetry and re-evaluation of patient's condition.  Otilio Carpen Yarianna Varble, PA-C [place] [facility]  Pager# [phone], if no answer [phone]

## 2019-09-28 NOTE — Progress Notes (Signed)
[facility] P[facility]M  Afib RVR BP high  Metoprolol 5mg  IV q6h Hold anticoagulation for now Monitor on tele  Roselie Awkward, MD [place] [facility] Pager: [phone] Cell: [phone] If no response, call [phone]

## 2019-09-28 NOTE — Plan of Care (Signed)
TRH pickup on 11/16.  See TRH communication for further details.

## 2019-09-28 NOTE — Progress Notes (Signed)
Patient arrived from [facility]. NRB in place from transport. Informed that patient had a fever prior to leaving [place] and was treated with 650mg  of Tylenol. Skin assessed with no major problems. Patient with bruising to Right axillae d/t known clavicle fracture and bruising to Right hip from known hematoma. Patient found lying on a medicine cup so skin had beginning of pressure injury to back. Will monitor closely.Resting comfortably at this time. MD at bedside.   Milford Cage, RN

## 2019-09-28 NOTE — Progress Notes (Signed)
[facility] Progress Note Patient Name: Summer Goodman DOB: 27-Jul-1946 MRN: [id number]   Date of Service  09/28/2019  HPI/Events of Note  New patient - 73 year old woman transferred from outside hospital for AKI, pleural effusion. No data available in Epic at this time. Is in no distress on camera, on a NRB mask which was placed for transport. RN notes she came in on a bicarb infusion.   eICU Interventions  Bedside ICU team notified of admission Please call E link if needed     Intervention Category Evaluation Type: New Patient Evaluation  Margaretmary Lombard 09/28/2019, 2:37 AM

## 2019-09-28 NOTE — Progress Notes (Signed)
[facility] P[facility]  S: Admitted on transfer from [place] overnight.  Had been diagnosed with a UTI and treated with bactrim as an outpatient, then developed worsening malaise, weakness, reduced PO intake.  Went to [facility] and found to have a left pleural effusion and AKI. Effusion was tapped there.  V/Q scan showed low prob PE, TTE showed EF 60-65%.  Transferred to our facility for further evaluation of ongoing renal failure.  She tells me her breathing is OK.  She has never had kidney failure prior to this.  She notes some diarrhea this morning.  Past Medical History:  Diagnosis Date  . Dementia ([place])   . Diabetes ([place])   . Hyperlipidemia   . Hypertension   . Osteoporosis    Vitals:   09/28/19 0712 09/28/19 0751 09/28/19 0845 09/28/19 0900  BP:   (!) 124/45 (!) 109/44  Pulse:  79 68 63  Resp:  (!) 23 17 16   Temp: 98.3 F (36.8 C)     TempSrc: Oral     SpO2:  90% 91% 92%  Weight:      Height:       General:  Resting comfortably in bed HENT: NCAT OP clear PULM: CTA B, normal effort CV: RRR, no mgr GI: BS+, soft, nontender MSK: normal bulk and tone Neuro: awake, alert, no distress, MAEW  CBC    Component Value Date/Time   WBC 15.3 (H) 09/28/2019 0356   RBC 4.08 09/28/2019 0356   HGB 12.7 09/28/2019 0356   HCT 39.7 09/28/2019 0356   PLT 186 09/28/2019 0356   MCV 97.3 09/28/2019 0356   MCH 31.1 09/28/2019 0356   MCHC 32.0 09/28/2019 0356   RDW 15.9 (H) 09/28/2019 0356   LYMPHSABS 1.0 09/28/2019 0356   MONOABS 1.1 (H) 09/28/2019 0356   EOSABS 0.2 09/28/2019 0356   BASOSABS 0.0 09/28/2019 0356   BMET    Component Value Date/Time   NA 141 09/28/2019 0356   K 3.4 (L) 09/28/2019 0356   CL 105 09/28/2019 0356   CO2 23 09/28/2019 0356   GLUCOSE 148 (H) 09/28/2019 0356   BUN 42 (H) 09/28/2019 0356   CREATININE 4.85 (H) 09/28/2019 0356   CALCIUM 7.8 (L) 09/28/2019 0356   GFRNONAA 8 (L) 09/28/2019 0356   GFRAA 10 (L) 09/28/2019 0356   CXR shows bilateral airspace  disease, some pleural effusion on left  Impression: AKI Diarrhea UTI Acute hypoxemic respiratory failure Left pleural effusion  Discussion: Picture seems most consistent with volume overload in setting of renal failure.  Not septic.    Plan: Stop bicarb drip Consult nephrology re AKI Hold lasix until nephrology sees patient Stop antibiotics Continue to administer O2 to maintain O2 saturation > 88% Regular diet  Transfer to SDU, [facility]  Roselie Awkward, MD [place] [facility] Pager: [phone] Cell: [phone] If no response, call [phone]

## 2019-09-29 ENCOUNTER — Encounter (HOSPITAL_COMMUNITY): Payer: Self-pay | Admitting: Cardiology

## 2019-09-29 DIAGNOSIS — E119 Type 2 diabetes mellitus without complications: Secondary | ICD-10-CM

## 2019-09-29 DIAGNOSIS — E785 Hyperlipidemia, unspecified: Secondary | ICD-10-CM

## 2019-09-29 DIAGNOSIS — I480 Paroxysmal atrial fibrillation: Secondary | ICD-10-CM

## 2019-09-29 DIAGNOSIS — G20 Parkinson's disease: Secondary | ICD-10-CM

## 2019-09-29 DIAGNOSIS — I4891 Unspecified atrial fibrillation: Secondary | ICD-10-CM

## 2019-09-29 DIAGNOSIS — F0281 Dementia in other diseases classified elsewhere with behavioral disturbance: Secondary | ICD-10-CM

## 2019-09-29 DIAGNOSIS — F329 Major depressive disorder, single episode, unspecified: Secondary | ICD-10-CM

## 2019-09-29 LAB — CBC WITH DIFFERENTIAL/PLATELET
Abs Immature Granulocytes: 0.08 10*3/uL — ABNORMAL HIGH (ref 0.00–0.07)
Basophils Absolute: 0.1 10*3/uL (ref 0.0–0.1)
Basophils Relative: 1 %
Eosinophils Absolute: 0.3 10*3/uL (ref 0.0–0.5)
Eosinophils Relative: 2 %
HCT: 38.1 % (ref 36.0–46.0)
Hemoglobin: 12.7 g/dL (ref 12.0–15.0)
Immature Granulocytes: 1 %
Lymphocytes Relative: 8 %
Lymphs Abs: 1.2 10*3/uL (ref 0.7–4.0)
MCH: 31.5 pg (ref 26.0–34.0)
MCHC: 33.3 g/dL (ref 30.0–36.0)
MCV: 94.5 fL (ref 80.0–100.0)
Monocytes Absolute: 1 10*3/uL (ref 0.1–1.0)
Monocytes Relative: 7 %
Neutro Abs: 12.4 10*3/uL — ABNORMAL HIGH (ref 1.7–7.7)
Neutrophils Relative %: 81 %
Platelets: 197 10*3/uL (ref 150–400)
RBC: 4.03 MIL/uL (ref 3.87–5.11)
RDW: 15.5 % (ref 11.5–15.5)
WBC: 15 10*3/uL — ABNORMAL HIGH (ref 4.0–10.5)
nRBC: 0 % (ref 0.0–0.2)

## 2019-09-29 LAB — RENAL FUNCTION PANEL
Albumin: 2.1 g/dL — ABNORMAL LOW (ref 3.5–5.0)
Anion gap: 15 (ref 5–15)
BUN: 33 mg/dL — ABNORMAL HIGH (ref 8–23)
CO2: 25 mmol/L (ref 22–32)
Calcium: 8.3 mg/dL — ABNORMAL LOW (ref 8.9–10.3)
Chloride: 100 mmol/L (ref 98–111)
Creatinine, Ser: 3.48 mg/dL — ABNORMAL HIGH (ref 0.44–1.00)
GFR calc Af Amer: 14 mL/min — ABNORMAL LOW (ref >60)
GFR calc non Af Amer: 12 mL/min — ABNORMAL LOW (ref >60)
Glucose, Bld: 238 mg/dL — ABNORMAL HIGH (ref 70–99)
Phosphorus: 2.8 mg/dL (ref 2.5–4.6)
Potassium: 3.3 mmol/L — ABNORMAL LOW (ref 3.5–5.1)
Sodium: 140 mmol/L (ref 135–145)

## 2019-09-29 LAB — GLUCOSE, CAPILLARY
Glucose-Capillary: 237 mg/dL — ABNORMAL HIGH (ref 70–99)
Glucose-Capillary: 217 mg/dL — ABNORMAL HIGH (ref 70–99)
Glucose-Capillary: 250 mg/dL — ABNORMAL HIGH (ref 70–99)
Glucose-Capillary: 170 mg/dL — ABNORMAL HIGH (ref 70–99)
Glucose-Capillary: 190 mg/dL — ABNORMAL HIGH (ref 70–99)

## 2019-09-29 LAB — MAGNESIUM: Magnesium: 1.6 mg/dL — ABNORMAL LOW (ref 1.7–2.4)

## 2019-09-29 MED ORDER — METOPROLOL TARTRATE 5 MG/5ML IV SOLN
5.0000 mg | Freq: Once | INTRAVENOUS | Status: AC
  Administered 2019-09-29: 5 mg via INTRAVENOUS

## 2019-09-29 MED ORDER — POTASSIUM CHLORIDE CRYS ER 20 MEQ PO TBCR
40.0000 meq | EXTENDED_RELEASE_TABLET | Freq: Every day | ORAL | Status: DC
  Administered 2019-09-29: 40 meq via ORAL
  Filled 2019-09-29: qty 2

## 2019-09-29 MED ORDER — DILTIAZEM HCL 30 MG PO TABS
30.0000 mg | ORAL_TABLET | Freq: Four times a day (QID) | ORAL | Status: DC
  Administered 2019-09-29 (×2): 30 mg via ORAL
  Filled 2019-09-29 (×3): qty 1

## 2019-09-29 MED ORDER — METOPROLOL TARTRATE 5 MG/5ML IV SOLN
INTRAVENOUS | Status: AC
  Filled 2019-09-29: qty 5

## 2019-09-29 MED ORDER — POTASSIUM CHLORIDE CRYS ER 20 MEQ PO TBCR: 40.0000 meq | EXTENDED_RELEASE_TABLET | Freq: Two times a day (BID) | ORAL | Status: DC

## 2019-09-29 MED ORDER — DILTIAZEM HCL 30 MG PO TABS
30.0000 mg | ORAL_TABLET | Freq: Four times a day (QID) | ORAL | Status: DC
  Filled 2019-09-29: qty 1

## 2019-09-29 MED ORDER — DILTIAZEM HCL-DEXTROSE 125-5 MG/125ML-% IV SOLN (PREMIX)
5.0000 mg/h | INTRAVENOUS | Status: DC
  Administered 2019-09-29: 5 mg/h via INTRAVENOUS
  Filled 2019-09-29: qty 125

## 2019-09-29 NOTE — Progress Notes (Signed)
[facility] Progress Note    Assessment/ Plan:   1. AKI - in patient who was getting Bactrim for UTI while on lisinopril for HTN, presented on 11/12 w/ creat 4.0 to outside hospital. Question vol overload as well, so sent to [facility] on 11/15 where creat is 4.85.  Here the pt had normal BP's, moderate edema LE's, L effusion by exam/ CXR, not in distress. Got IV lasix 100 cc 4.0L overnight.  AKI due to acei/ dehydration/ bactrim causing ATN most likely.  UA w/o sig proteinuria or hematuria to suggest glomerular disease. Cr coming down to 3.48, good UOP- would hold further diuretics today and assess tomorrow.  I expect she will autodiurese.  No indication for RRT.  We will sign off.  Call with questions.   2. Volume overload - L effusion, LE edema, as above. ECHO at outside showed normal LVEF.  3. Atrial fib - on IV amiodarone, in and out of afib here 4. Recent UTI - UA here negative 5. IDDM 6. Dementia - seems pretty oriented here 7. Depression 8. HL  Subjective:    Creatinine down to 3.4.  Afib with RVR noted overnight.  Sig net negative with 4L UOP   Objective:   BP (!) 138/49   Pulse (!) 118   Temp (!) 97.5 F (36.4 C) (Oral)   Resp 19   Ht 5' (1.524 m)   Wt 66.7 kg   LMP  (LMP Unknown)   SpO2 91%   BMI 28.72 kg/m   Intake/Output Summary (Last 24 hours) at 09/29/2019 0935 Last data filed at 09/29/2019 0600 Gross per 24 hour  Intake 609.35 ml  Output 3400 ml  Net -2790.65 ml   Weight change: 2.6 kg  Physical Exam: [id number] woman, on RA, getting a bath [id number] Resp: unlabored EXT: not significant LE edema MSK: R hip bruise  Imaging: US Renal  Result Date: 09/28/2019 CLINICAL DATA:  Initial evaluation for acute renal insufficiency. EXAM: RENAL / URINARY TRACT ULTRASOUND COMPLETE COMPARISON:  Prior CT from 09/25/2019. FINDINGS: Right Kidney: Renal measurements: 11.0 x 3.9 x 4.6 cm = volume: 101.1 mL. Echogenicity within normal limits. No nephrolithiasis  or hydronephrosis. 9 mm echogenic lesion within the mid-lower right kidney consistent with previously identified small AML. Small extrarenal pelvis noted. Left Kidney: Renal measurements: 11.0 x 4.2 x 3.3 cm = volume: 78.8 mL. Echogenicity within normal limits. No mass or hydronephrosis visualized. Bladder: Decompressed with a Foley catheter in place. Other: None. IMPRESSION: 1. No hydronephrosis or other acute finding. 2. 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 Electronically Signed   By: Jeannine Boga M.D.   On: 09/28/2019 13:44   Dg Chest Port 1 View  Result Date: 09/28/2019 CLINICAL DATA:  Shortness of breath EXAM: PORTABLE CHEST 1 VIEW COMPARISON:  09/27/2019 FINDINGS: Moderate to large left pleural effusion, decreased since prior study. Small right effusion likely present. Bibasilar atelectasis. Mild vascular congestion. Heart is mildly enlarged. IMPRESSION: Moderate to large left pleural effusion, decreased since prior study. Suspect small right effusion. Cardiomegaly, vascular congestion. Bibasilar atelectasis. Electronically Signed   By: Rolm Baptise M.D.   On: 09/28/2019 03:23    Labs: BMET Recent Labs  Lab 09/28/19 0335 09/28/19 0356 09/29/19 0351  NA 140 141 140  K 3.2* 3.4* 3.3*  CL  --  105 100  CO2  --  23 25  GLUCOSE  --  148* 238*  BUN  --  42* 33*  CREATININE  --  4.85* 3.48*  CALCIUM  --  7.8* 8.3*  PHOS  --  4.1 2.8   CBC Recent Labs  Lab 09/28/19 0335 09/28/19 0356  WBC  --  15.3*  NEUTROABS  --  12.9*  HGB 11.9* 12.7  HCT 35.0* 39.7  MCV  --  97.3  PLT  --  186    Medications:    . Chlorhexidine Gluconate Cloth  6 each Topical Daily  . heparin  5,000 Units Subcutaneous Q8H  . influenza vaccine adjuvanted  0.5 mL Intramuscular Tomorrow-1000  . insulin aspart  0-5 Units Subcutaneous QHS  . insulin aspart  0-9 Units Subcutaneous TID WC  . PARoxetine  20 mg Oral Daily  . potassium chloride  40 mEq Oral Daily       Madelon Lips MD 09/29/2019, 9:35 AM

## 2019-09-29 NOTE — Progress Notes (Signed)
[facility]  PROGRESS NOTE    Summer Goodman  [id number] DOB: 1945/12/27 DOA: 09/28/2019 PCP: Patient, No Pcp Per   Brief Narrative:   Admitted on transfer from [facility] overnight.  Had been diagnosed with a UTI and treated with bactrim as an outpatient, then developed worsening malaise, weakness, reduced PO intake.  Went to [facility] and found to have a left pleural effusion and AKI. Effusion was tapped there.  V/Q scan showed low prob PE, TTE showed EF 60-65%.  Transferred to our facility for further evaluation of ongoing renal failure.  11/16: In a fib RVR this AM despite amiondarone gtt and IV metoprolol scheduled.    Assessment & Plan:   Active Problems:   AKI (acute kidney injury) ([place])   Acute respiratory failure with hypoxia (HCC)   Acute cystitis without hematuria  Non-oliguric Acute Renal Failure     - followed by nephro; have now s/o'd     - UOP ok     - SCr is coming down     - Continue to monitor  Acute Hypoxic Respiratory Failure with L pleural Effusion     - transitioned from non-rebreather mask to HFNC     - CT chest from 11/14 with L rounded pleural based opacity in the lingula, underlying mass not excluded, no imaging CD sent over      - pleural effusion tapped at [facility] per PCCM; follow up thoracentesis results     - abx held per PCCM     - procal 0.32; RVP is negative  Pyuria     - not severely septic and obstructive uropathy     - continue Foley     - abx stopped by PCCM  Type 2 DM     - Hold home Glipizide and Actos     - continue SSI  HTN     - Normotensive on arrival     - home Lisinopril, Norvasc, Lopressor, HCTZ on hold     - monitor BP for resumption of meds  HLD     - home lovastatin on hold  Dementia Depression     - home regimen is klonopin and paxil; continue  A fib RVR     - started in A fib yesterday and was placed on amiodarone gtt, metoprolol 5mg  IV q6h     - still with rates into 160-170s this AM     - given booster dose  of IV metoprolol with no effect     - started diltiazem gtt in addition to the amiodarone; metoprolol d/c'd     - consult cards, appreciate assistance  DVT prophylaxis: heparin Code Status: FULL   Disposition Plan: TBD  Consultants:   Cardiology  Nephrology  PCCM   ROS:  Reports palpitations. Denies CP, N, V, ab pain. Remainder 10-pt ROS is negative for all not previously mentioned.  Subjective: "Ok. That sounds ok."  Objective: Vitals:   09/29/19 1030 09/29/19 1100 09/29/19 1118 09/29/19 1130  BP: (!) 134/55 (!) 142/58  (!) 145/66  Pulse: 82 79  84  Resp: 18 17  (!) 22  Temp:   98.3 F (36.8 C)   TempSrc:   Oral   SpO2: 94% 93%  91%  Weight:      Height:        Intake/Output Summary (Last 24 hours) at 09/29/2019 1236 Last data filed at 09/29/2019 1130 Gross per 24 hour  Intake 565.2 ml  Output 2885 ml  Net -2319.8 ml  Filed Weights   09/28/19 0239 09/28/19 1029 09/29/19 0445  Weight: 68.8 kg 71.4 kg 66.7 kg    Examination:  General: 73 y.o. ill appearing female resting in bed Cardiovascular: tachy irregular, +S1, S2, no m/g/r Respiratory: CTABL, no w/r/r, normal WOB GI: BS+, NDNT, no masses noted, no organomegaly noted MSK: No e/c/c Neuro: alert to name, follows commands Psyc: Appropriate interaction and affect, calm/cooperative   Data Reviewed: I have personally reviewed following labs and imaging studies.  CBC: Recent Labs  Lab 09/28/19 0335 09/28/19 0356 09/29/19 0946  WBC  --  15.3* 15.0*  NEUTROABS  --  12.9* 12.4*  HGB 11.9* 12.7 12.7  HCT 35.0* 39.7 38.1  MCV  --  97.3 94.5  PLT  --  186 [id number]   Basic Metabolic Panel: Recent Labs  Lab 09/28/19 0335 09/28/19 0356 09/29/19 0351 09/29/19 0946  NA 140 141 140  --   K 3.2* 3.4* 3.3*  --   CL  --  105 100  --   CO2  --  23 25  --   GLUCOSE  --  148* 238*  --   BUN  --  42* 33*  --   CREATININE  --  4.85* 3.48*  --   CALCIUM  --  7.8* 8.3*  --   MG  --  1.7  --  1.6*  PHOS  --   4.1 2.8  --    GFR: Estimated Creatinine Clearance: 12.1 mL/min (A) (by C-G formula based on SCr of 3.48 mg/dL (H)). Liver Function Tests: Recent Labs  Lab 09/28/19 0356 09/29/19 0351  AST 19  --   ALT 16  --   ALKPHOS 75  --   BILITOT 0.5  --   PROT 5.0*  --   ALBUMIN 2.2* 2.1*   No results for input(s): LIPASE, AMYLASE in the last 168 hours. No results for input(s): AMMONIA in the last 168 hours. Coagulation Profile: No results for input(s): INR, PROTIME in the last 168 hours. Cardiac Enzymes: No results for input(s): CKTOTAL, CKMB, CKMBINDEX, TROPONINI in the last 168 hours. BNP (last 3 results) No results for input(s): PROBNP in the last 8760 hours. HbA1C: Recent Labs    09/28/19 0356  HGBA1C 5.8*   CBG: Recent Labs  Lab 09/28/19 0709 09/28/19 1118 09/28/19 1705 09/28/19 2200 09/29/19 0713  GLUCAP 180* 258* 211* 196* 237*   Lipid Profile: No results for input(s): CHOL, HDL, LDLCALC, TRIG, CHOLHDL, LDLDIRECT in the last 72 hours. Thyroid Function Tests: No results for input(s): TSH, T4TOTAL, FREET4, T3FREE, THYROIDAB in the last 72 hours. Anemia Panel: No results for input(s): VITAMINB12, FOLATE, FERRITIN, TIBC, IRON, RETICCTPCT in the last 72 hours. Sepsis Labs: Recent Labs  Lab 09/28/19 0356 09/28/19 0738  PROCALCITON 0.32  --   LATICACIDVEN 0.9 0.9    Recent Results (from the past 240 hour(s))  MRSA PCR Screening     Status: None   Collection Time: 09/28/19  2:51 AM   Specimen: Nasal Mucosa; Nasopharyngeal  Result Value Ref Range Status   MRSA by PCR NEGATIVE NEGATIVE Final    Comment:        The GeneXpert MRSA Assay (FDA approved for NASAL specimens only), is one component of a comprehensive MRSA colonization surveillance program. It is not intended to diagnose MRSA infection nor to guide or monitor treatment for MRSA infections. Performed at [facility], [place] [address] [place], [place] [address]   Respiratory Panel  by PCR  Status: None   Collection Time: 09/28/19  5:00 AM   Specimen: Nasopharyngeal Swab; Respiratory  Result Value Ref Range Status   Adenovirus NOT DETECTED NOT DETECTED Final   Coronavirus 229E NOT DETECTED NOT DETECTED Final    Comment: (NOTE) The Coronavirus on the Respiratory Panel, DOES NOT test for the novel  Coronavirus (2019 nCoV)    Coronavirus HKU1 NOT DETECTED NOT DETECTED Final   Coronavirus NL63 NOT DETECTED NOT DETECTED Final   Coronavirus OC43 NOT DETECTED NOT DETECTED Final   Metapneumovirus NOT DETECTED NOT DETECTED Final   Rhinovirus / Enterovirus NOT DETECTED NOT DETECTED Final   Influenza A NOT DETECTED NOT DETECTED Final   Influenza B NOT DETECTED NOT DETECTED Final   Parainfluenza Virus 1 NOT DETECTED NOT DETECTED Final   Parainfluenza Virus 2 NOT DETECTED NOT DETECTED Final   Parainfluenza Virus 3 NOT DETECTED NOT DETECTED Final   Parainfluenza Virus 4 NOT DETECTED NOT DETECTED Final   Respiratory Syncytial Virus NOT DETECTED NOT DETECTED Final   Bordetella pertussis NOT DETECTED NOT DETECTED Final   Chlamydophila pneumoniae NOT DETECTED NOT DETECTED Final   Mycoplasma pneumoniae NOT DETECTED NOT DETECTED Final    Comment: Performed at [facility], [place]. [address], [place], [place] [address]      Radiology Studies: US Renal  Result Date: 09/28/2019 CLINICAL DATA:  Initial evaluation for acute renal insufficiency. EXAM: RENAL / URINARY TRACT ULTRASOUND COMPLETE COMPARISON:  Prior CT from 09/25/2019. FINDINGS: Right Kidney: Renal measurements: 11.0 x 3.9 x 4.6 cm = volume: 101.1 mL. Echogenicity within normal limits. No nephrolithiasis or hydronephrosis. 9 mm echogenic lesion within the mid-lower right kidney consistent with previously identified small AML. Small extrarenal pelvis noted. Left Kidney: Renal measurements: 11.0 x 4.2 x 3.3 cm = volume: 78.8 mL. Echogenicity within normal limits. No mass or hydronephrosis visualized. Bladder: Decompressed  with a Foley catheter in place. Other: None. IMPRESSION: 1. No hydronephrosis or other acute finding. 2. 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 Electronically Signed   By: Jeannine Boga M.D.   On: 09/28/2019 13:44   Dg Chest Port 1 View  Result Date: 09/28/2019 CLINICAL DATA:  Shortness of breath EXAM: PORTABLE CHEST 1 VIEW COMPARISON:  09/27/2019 FINDINGS: Moderate to large left pleural effusion, decreased since prior study. Small right effusion likely present. Bibasilar atelectasis. Mild vascular congestion. Heart is mildly enlarged. IMPRESSION: Moderate to large left pleural effusion, decreased since prior study. Suspect small right effusion. Cardiomegaly, vascular congestion. Bibasilar atelectasis. Electronically Signed   By: Rolm Baptise M.D.   On: 09/28/2019 03:23     Scheduled Meds:  Chlorhexidine Gluconate Cloth  6 each Topical Daily   heparin  5,000 Units Subcutaneous Q8H   insulin aspart  0-5 Units Subcutaneous QHS   insulin aspart  0-9 Units Subcutaneous TID WC   PARoxetine  20 mg Oral Daily   potassium chloride  40 mEq Oral Daily   Continuous Infusions:  amiodarone 30 mg/hr (09/29/19 1211)   diltiazem (CARDIZEM) infusion 5 mg/hr (09/29/19 1100)     LOS: 1 day    Time spent: 35 minutes spent in the coordination of care today.    Jonnie Finner, DO [facility] Pager [phone]  If 7PM-7AM, please contact night-coverage [email or web address] Password [facility] 09/29/2019, 12:36 PM

## 2019-09-29 NOTE — Consult Note (Addendum)
Cardiology Consultation:   Patient ID: Summer Goodman MRN: [id number]; DOB: 1946/06/04  Admit date: 09/28/2019 Date of Consult: 09/29/2019  Primary Care Provider: Patient, No Pcp Per Primary Cardiologist: No primary care provider on file. New to [place], lives in [facility] Primary Electrophysiologist:  None    Patient Profile:   Summer Goodman is a 73 y.o. female with a hx of hypertension, hyperlipidemia, Summer Goodman type 2 and Summer Goodman who is being seen today for the evaluation of atrial fibrillation with RVR at the request of Dr. Marylyn Ishihara.  History of Present Illness:   Summer Goodman was transferred here from [facility] overnight.  She had been diagnosed with UTI and treated with Bactrim as an outpatient.  She developed worsening malaise, weakness and reduced oral intake.  She presented to [facility] and was found to have a left pleural effusion and AKI.  The effusion was tapped at [facility].  VQ scan showed low probability for PE.  Echocardiogram showed EF 60-65%.  The patient was transferred to [facility] for further evaluation of ongoing renal failure.  She is also treated for acute hypoxic respiratory failure.  She was transitioned from nonrebreather mask to high flow nasal cannula.  At [facility] she was noted to have oliguric acute renal failure related to combination of acute UTI, dehydration and nephrotoxic medications. She was given IV fluids.   The patient was felt to be volume overloaded with moderate lower extremity edema and pleural effusion but was not in distress.  She was given IV Lasix 100 mg x 1.  She had 4 L of urine output overnight.  She is being seen by nephrology who feels that AKI is due to ACE inhibitor/dehydration/Bactrim causing ATN most likely.  Currently holding further diuretics with plan to assess tomorrow.  She is expected to auto diurese.  Nephrology felt there was no indication for RRT and they are signing off.  The patient developed atrial  fibrillation and was started on amiodarone drip as well as scheduled metoprolol IV, however she developed rapid ventricular response this morning with no response to IV metoprolol.  She was started on diltiazem drip in addition to amiodarone and metoprolol was discontinued. Currently she is maintaining sinus rhythm.   I have seen the patient in her room with her husband present. Her husband says that she had become lethargic which reminded him of how she acted when she had pneumonia in the past so he took her to the ED. Summer Goodman seems to respond slowly but is appropriate. She denies any prior known afib or any other cardiac issues. She is not followed by cardiology. She is independent at home. She denies any current or recent chest discomfort, shortness of breath or palpitations. She has occ pedal edema, but has none at this time. She denies orthopnea. She denies any prior bleeding history. Her husband says that she was noted to have had a prior stroke by imaging but was never treated for any neurologic deficits. Their son passed away in 2023-08-26 for an MI on top of other medical issues and her husband says that this has been hard on the patient.    Chest x-ray done yesterday showed moderate-large left pleural effusion, decreased since prior study.  Suspected small right effusion.  Cardiomegaly and vascular congestion.  Serum creatinine was elevated at 5.6 at [facility], 4.85 yesterday, 3.48 today.  Renal function was normal by labs on 07/17/2019 at [place] with serum creatinine 0.81 and GFR 72.   Pro-BNP 13200 TSH  1.51 SARS CoV-2 neqative   Heart Pathway Score:     Past Medical History:  Diagnosis Date   Summer Goodman ([place])    Summer Goodman ([place])    Hyperlipidemia    Hypertension    Osteoporosis     History reviewed. No pertinent surgical history.   Home Medications:  Prior to Admission medications   Medication Sig Start Date End Date Taking? Authorizing Provider  amLODipine (NORVASC)  10 MG tablet Take 10 mg by mouth daily. 10/25/15   [provider]  clonazePAM (KLONOPIN) 2 MG tablet Take 2 mg by mouth.  10/19/15   [provider]  glipiZIDE (GLUCOTROL) 5 MG tablet Take 5 mg by mouth 2 (two) times daily. 08/13/19 08/13/21  [provider]  hydrochlorothiazide (HYDRODIURIL) 25 MG tablet Take 25 mg by mouth.  10/25/15   [provider]  lisinopril (ZESTRIL) 40 MG tablet Take 40 mg by mouth daily. 10/25/15   [provider]  lovastatin (MEVACOR) 20 MG tablet Take 20 mg by mouth daily. 10/25/15   [provider]  metoprolol tartrate (LOPRESSOR) 100 MG tablet Take 100 mg by mouth.  06/30/14   [provider]  oxybutynin (DITROPAN-XL) 10 MG 24 hr tablet Take 10 mg by mouth daily.  09/19/19   [provider]  PARoxetine (PAXIL) 20 MG tablet Take 20 mg by mouth daily. 10/25/15   [provider]  phenazopyridine (PYRIDIUM) 200 MG tablet Take 200 mg by mouth 3 (three) times daily. 09/22/19 09/21/20  [provider]  pioglitazone (ACTOS) 30 MG tablet Take 30 mg by mouth daily. 08/20/19   [provider]  potassium chloride (KLOR-CON) 10 MEQ tablet Take 10 mEq by mouth daily. 09/08/19   [provider]  sulfamethoxazole-trimethoprim (BACTRIM DS) 800-160 MG tablet Take 1 tablet by mouth 2 (two) times daily. 09/22/19   [provider]  venlafaxine XR (EFFEXOR-XR) 75 MG 24 hr capsule Take 75 mg by mouth daily. 02/10/19   [provider]    Inpatient Medications: Scheduled Meds:  Chlorhexidine Gluconate Cloth  6 each Topical Daily   heparin  5,000 Units Subcutaneous Q8H   insulin aspart  0-5 Units Subcutaneous QHS   insulin aspart  0-9 Units Subcutaneous TID WC   PARoxetine  20 mg Oral Daily   potassium chloride  40 mEq Oral Daily   Continuous Infusions:  amiodarone 30 mg/hr (09/29/19 1211)   diltiazem (CARDIZEM) infusion 5 mg/hr (09/29/19 1100)   PRN  Meds: clonazePAM  Allergies:    Allergies  Allergen Reactions   Metformin And Related Diarrhea    Social History:   Social History   Socioeconomic History   Marital status: Married    Spouse name: Not on file   Number of children: Not on file   Years of education: Not on file   Highest education level: Not on file  Occupational History   Not on file  Social Needs   Financial resource strain: Not on file   Food insecurity    Worry: Not on file    Inability: Not on file   Transportation needs    Medical: Not on file    Non-medical: Not on file  Tobacco Use   Smoking status: Not on file  Substance and Sexual Activity   Alcohol use: Not on file   Drug use: Not on file   Sexual activity: Not on file  Lifestyle   Physical activity    Days per week: Not on file  Minutes per session: Not on file   Stress: Not on file  Relationships   Social connections    Talks on phone: Not on file    Gets together: Not on file    Attends religious service: Not on file    Active member of club or organization: Not on file    Attends meetings of clubs or organizations: Not on file    Relationship status: Not on file   Intimate partner violence    Fear of current or ex partner: Not on file    Emotionally abused: Not on file    Physically abused: Not on file    Forced sexual activity: Not on file  Other Topics Concern   Not on file  Social History Narrative   Not on file    Family History:    Family History  Problem Relation Age of Onset   Ovarian cancer Mother    Summer Goodman's disease Father    Lung cancer Sister    Leukemia Sister        CLL   Hyperlipidemia Sister      ROS:  Please see the history of present illness.   All other ROS reviewed and negative.     Physical Exam/Data:   Vitals:   09/29/19 1245 09/29/19 1300 09/29/19 1315 09/29/19 1330  BP: (!) 145/85 (!) 142/48 (!) 131/49 (!) 133/54  Pulse: 83 80 76   Resp: 18 (!) 21 19 (!) 21   Temp:      TempSrc:      SpO2: 95% 95% 94%   Weight:      Height:        Intake/Output Summary (Last 24 hours) at 09/29/2019 1443 Last data filed at 09/29/2019 1130 Gross per 24 hour  Intake 498.83 ml  Output 2885 ml  Net -2386.17 ml   Last 3 Weights 09/29/2019 09/28/2019 09/28/2019  Weight (lbs) 147 lb 0.8 oz 157 lb 6.5 oz 151 lb 10.8 oz  Weight (kg) 66.7 kg 71.4 kg 68.8 kg     Body mass index is 28.72 kg/m.  General:  Well nourished, well developed, in no acute distress HEENT: normal Lymph: no adenopathy Neck: no JVD Endocrine:  No thryomegaly Vascular: No carotid bruits; FA pulses 2+ bilaterally without bruits  Cardiac:  normal S1, S2; RRR; no murmur  Lungs:  clear to auscultation bilaterally, no wheezing, rhonchi or rales  Abd: soft, nontender, no hepatomegaly  Ext: no edema Musculoskeletal:  No deformities, BUE and BLE strength normal and equal Skin: warm and dry  Neuro:  CNs 2-12 intact, no focal abnormalities noted, Pt is slow to respond and appears sleepy, but is oriented.  Psych:  Blunted affect   EKG:  The EKG was personally reviewed and demonstrates:  Atrial fibrillation with RVR, 150 bpm Telemetry:  Telemetry was personally reviewed and demonstrates:  NSR in the 70's since ~09:30. She had just over an hour of afib with RVR this am, 160's  Relevant CV Studies:  Echo at [facility] 09/26/2019  Technically difficult study with suboptimal views.  Normal LV wall thickness. EF 60-65% Pseudonormal LV filling pattern, consistent with elevated LA pressure. Mild MR Mild TR Estimated RA pressure 15 mm/Hg  Laboratory Data:  High Sensitivity Troponin:  No results for input(s): TROPONINIHS in the last 720 hours.   Chemistry Recent Labs  Lab 09/28/19 0335 09/28/19 0356 09/29/19 0351  NA 140 141 140  K 3.2* 3.4* 3.3*  CL  --  105 100  CO2  --  23 25  GLUCOSE  --  148* 238*  BUN  --  42* 33*  CREATININE  --  4.85* 3.48*  CALCIUM  --  7.8* 8.3*   GFRNONAA  --  8* 12*  GFRAA  --  10* 14*  ANIONGAP  --  13 15    Recent Labs  Lab 2019-10-11 0356 09/29/19 0351  PROT 5.0*  --   ALBUMIN 2.2* 2.1*  AST 19  --   ALT 16  --   ALKPHOS 75  --   BILITOT 0.5  --    Hematology Recent Labs  Lab 2019/10/11 0335 10-11-2019 0356 09/29/19 0946  WBC  --  15.3* 15.0*  RBC  --  4.08 4.03  HGB 11.9* 12.7 12.7  HCT 35.0* 39.7 38.1  MCV  --  97.3 94.5  MCH  --  31.1 31.5  MCHC  --  32.0 33.3  RDW  --  15.9* 15.5  PLT  --  186 197   BNPNo results for input(s): BNP, PROBNP in the last 168 hours.  DDimer No results for input(s): DDIMER in the last 168 hours.   Radiology/Studies:  US Renal  Result Date: October 11, 2019 CLINICAL DATA:  Initial evaluation for acute renal insufficiency. EXAM: RENAL / URINARY TRACT ULTRASOUND COMPLETE COMPARISON:  Prior CT from 09/25/2019. FINDINGS: Right Kidney: Renal measurements: 11.0 x 3.9 x 4.6 cm = volume: 101.1 mL. Echogenicity within normal limits. No nephrolithiasis or hydronephrosis. 9 mm echogenic lesion within the mid-lower right kidney consistent with previously identified small AML. Small extrarenal pelvis noted. Left Kidney: Renal measurements: 11.0 x 4.2 x 3.3 cm = volume: 78.8 mL. Echogenicity within normal limits. No mass or hydronephrosis visualized. Bladder: Decompressed with a Foley catheter in place. Other: None. IMPRESSION: 1. No hydronephrosis or other acute finding. 2. 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 Electronically Signed   By: Jeannine Boga M.D.   On: 10/11/2019 13:44   Dg Chest Port 1 View  Result Date: 2019/10/11 CLINICAL DATA:  Shortness of breath EXAM: PORTABLE CHEST 1 VIEW COMPARISON:  09/27/2019 FINDINGS: Moderate to large left pleural effusion, decreased since prior study. Small right effusion likely present. Bibasilar atelectasis. Mild vascular congestion. Heart is mildly enlarged. IMPRESSION: Moderate to large left pleural effusion, decreased since  prior study. Suspect small right effusion. Cardiomegaly, vascular congestion. Bibasilar atelectasis. Electronically Signed   By: Rolm Baptise M.D.   On: 10-11-2019 03:23    Assessment and Plan:   Atrial fibrillation with RVR -No documented prior history of afib found.  -Patient developed atrial fibrillation on October 11, 2019 and was started on IV amiodarone drip along with scheduled IV metoprolol, however she developed rapid ventricular response this morning and was switched from metoprolol to IV Cardizem, now at 5 mg/h.  Blood pressure is currently stable. -currently in normal sinus rhyth in the 70's since ~09:30 this am.  -Correct potassium levels -No currently anticoagulated. This episode may be circumstantial related to acute illness. Would defer anticoagulation for now.  -With improvement in her acute illness, AKI and acute resp failure, will stop amiodarone and switch cardizem from IV to oral dosing with 30 mg q 6h and monitor. Can later consolidate cardizem once effective dosing known.      Acute kidney injury -Serum creatinine was elevated at 5.6 at OSH, 4.85 yesterday, 3.48 today.  Renal function was normal by labs on 07/17/2019 at [place] with serum creatinine 0.81 and GFR 72.  -The patient was mildly volume overloaded  and given IV Lasix 100 mg x 1.  She had 4 L of urine output overnight.  She was seen by nephrology who feels that AKI is due to ACE inhibitor/dehydration/Bactrim causing ATN most likely.  Currently holding further diuretics with plan to assess tomorrow.  She is expected to auto diurese.  Nephrology felt there was no indication for RRT and they are signing off.  ?acute diastolic heart failure -Elevated ProBNP at [place]. Possibly related to diastolic dysfunction vs renal failure. She was given IV fluids at [facility]. Was given lasix 100 mg X1 yesterday with good urine output.  -currently appears euvolemic.   Acute respiratory failure -Pt has large left pleural effusion  that was tapped at [place] with removal of 650 cc. Chest x-ray done yesterday showed moderate-large left pleural effusion, decreased since prior study.  Suspected small right effusion.  Medical team to follow up on lab results from pleural fluid.   Hypokalemia -Serum potassium level was 3.2 on presentation yesterday, 3.3 today. -Patient is ordered oral potassium supplementation.  Hypertension -Home medications include amlodipine 10 mg daily, hydrochlorothiazide 25 mg daily, lisinopril 40 mg daily, metoprolol tartrate 100 mg.  Home medications are currently on hold due to initial hypotension and acute renal failure.  -Blood pressures are currently stable 120s-140s/50s.  -With the addition of cardizem for afib, this may replace her home amlodipine.   Hyperlipidemia -On lovastatin 20 mg at home  Summer Goodman type 2 -Oral antidiabetic agents on hold, currently on sliding scale insulin. -A1c 5.8, good control.  Summer Goodman -Appears oriented. Husband reports that pt is independent at home.       For questions or updates, please contact [place] Please consult [email or web address] for contact info under     Signed, Daune Perch, NP  09/29/2019 2:43 PM   I have examined the patient and reviewed assessment and plan and discussed with patient.  Agree with above as stated.  AFib with RVR in the setting of ARF and UTI.  Agree with hydration.  Back in NSR.  Echo from [place] was essentially normal.  Would not anticoagulate one term for this episode.  Will stop Amio.  Change Cardizem to oral, 30 mg QID.    If AFib recurs in the future, would have to reconsider benefits vs. Risks of anticoagulation.  Summer Goodman listed in problem list.  WOuld have to assess fall risk.  She does appear frail, but difficult to make a judgement since this is the first time I am seeing her and it is in the setting of critical illness.     Larae Grooms

## 2019-09-30 ENCOUNTER — Inpatient Hospital Stay (HOSPITAL_COMMUNITY): Payer: Medicare Other

## 2019-09-30 LAB — CBC WITH DIFFERENTIAL/PLATELET
Abs Immature Granulocytes: 0.11 10*3/uL — ABNORMAL HIGH (ref 0.00–0.07)
Basophils Absolute: 0.1 10*3/uL (ref 0.0–0.1)
Basophils Relative: 0 %
Eosinophils Absolute: 0.1 10*3/uL (ref 0.0–0.5)
Eosinophils Relative: 1 %
HCT: 35.3 % — ABNORMAL LOW (ref 36.0–46.0)
Hemoglobin: 11.7 g/dL — ABNORMAL LOW (ref 12.0–15.0)
Immature Granulocytes: 1 %
Lymphocytes Relative: 10 %
Lymphs Abs: 1.4 10*3/uL (ref 0.7–4.0)
MCH: 31.5 pg (ref 26.0–34.0)
MCHC: 33.1 g/dL (ref 30.0–36.0)
MCV: 94.9 fL (ref 80.0–100.0)
Monocytes Absolute: 1.1 10*3/uL — ABNORMAL HIGH (ref 0.1–1.0)
Monocytes Relative: 8 %
Neutro Abs: 10.8 10*3/uL — ABNORMAL HIGH (ref 1.7–7.7)
Neutrophils Relative %: 80 %
Platelets: 202 10*3/uL (ref 150–400)
RBC: 3.72 MIL/uL — ABNORMAL LOW (ref 3.87–5.11)
RDW: 15.2 % (ref 11.5–15.5)
WBC: 13.5 10*3/uL — ABNORMAL HIGH (ref 4.0–10.5)
nRBC: 0 % (ref 0.0–0.2)

## 2019-09-30 LAB — BASIC METABOLIC PANEL
Anion gap: 11 (ref 5–15)
BUN: 18 mg/dL (ref 8–23)
CO2: 27 mmol/L (ref 22–32)
Calcium: 7.9 mg/dL — ABNORMAL LOW (ref 8.9–10.3)
Chloride: 98 mmol/L (ref 98–111)
Creatinine, Ser: 1.73 mg/dL — ABNORMAL HIGH (ref 0.44–1.00)
GFR calc Af Amer: 33 mL/min — ABNORMAL LOW (ref >60)
GFR calc non Af Amer: 29 mL/min — ABNORMAL LOW (ref >60)
Glucose, Bld: 191 mg/dL — ABNORMAL HIGH (ref 70–99)
Potassium: 3.1 mmol/L — ABNORMAL LOW (ref 3.5–5.1)
Sodium: 136 mmol/L (ref 135–145)

## 2019-09-30 LAB — RENAL FUNCTION PANEL
Albumin: 2.1 g/dL — ABNORMAL LOW (ref 3.5–5.0)
Anion gap: 16 — ABNORMAL HIGH (ref 5–15)
BUN: 22 mg/dL (ref 8–23)
CO2: 24 mmol/L (ref 22–32)
Calcium: 8.1 mg/dL — ABNORMAL LOW (ref 8.9–10.3)
Chloride: 100 mmol/L (ref 98–111)
Creatinine, Ser: 2.05 mg/dL — ABNORMAL HIGH (ref 0.44–1.00)
GFR calc Af Amer: 27 mL/min — ABNORMAL LOW (ref >60)
GFR calc non Af Amer: 23 mL/min — ABNORMAL LOW (ref >60)
Glucose, Bld: 213 mg/dL — ABNORMAL HIGH (ref 70–99)
Phosphorus: 2.1 mg/dL — ABNORMAL LOW (ref 2.5–4.6)
Potassium: 3.1 mmol/L — ABNORMAL LOW (ref 3.5–5.1)
Sodium: 140 mmol/L (ref 135–145)

## 2019-09-30 LAB — GLUCOSE, CAPILLARY
Glucose-Capillary: 135 mg/dL — ABNORMAL HIGH (ref 70–99)
Glucose-Capillary: 148 mg/dL — ABNORMAL HIGH (ref 70–99)
Glucose-Capillary: 163 mg/dL — ABNORMAL HIGH (ref 70–99)
Glucose-Capillary: 184 mg/dL — ABNORMAL HIGH (ref 70–99)
Glucose-Capillary: 218 mg/dL — ABNORMAL HIGH (ref 70–99)

## 2019-09-30 LAB — MAGNESIUM: Magnesium: 1.5 mg/dL — ABNORMAL LOW (ref 1.7–2.4)

## 2019-09-30 LAB — PHOSPHORUS: Phosphorus: 1.6 mg/dL — ABNORMAL LOW (ref 2.5–4.6)

## 2019-09-30 LAB — HEPARIN LEVEL (UNFRACTIONATED): Heparin Unfractionated: 0.48 IU/mL (ref 0.30–0.70)

## 2019-09-30 MED ORDER — POTASSIUM PHOSPHATE MONOBASIC 500 MG PO TABS
500.0000 mg | ORAL_TABLET | Freq: Three times a day (TID) | ORAL | Status: DC
  Filled 2019-09-30 (×3): qty 1

## 2019-09-30 MED ORDER — AMIODARONE HCL IN DEXTROSE 360-4.14 MG/200ML-% IV SOLN
60.0000 mg/h | INTRAVENOUS | Status: DC
  Administered 2019-09-30: 60 mg/h via INTRAVENOUS

## 2019-09-30 MED ORDER — MAGNESIUM SULFATE 2 GM/50ML IV SOLN
2.0000 g | Freq: Once | INTRAVENOUS | Status: AC
  Administered 2019-09-30: 2 g via INTRAVENOUS
  Filled 2019-09-30: qty 50

## 2019-09-30 MED ORDER — DILTIAZEM HCL-DEXTROSE 125-5 MG/125ML-% IV SOLN (PREMIX)
5.0000 mg/h | INTRAVENOUS | Status: DC
  Administered 2019-09-30 (×2): 15 mg/h via INTRAVENOUS
  Filled 2019-09-30 (×2): qty 125

## 2019-09-30 MED ORDER — DILTIAZEM HCL 30 MG PO TABS
30.0000 mg | ORAL_TABLET | Freq: Four times a day (QID) | ORAL | Status: DC
  Administered 2019-09-30 – 2019-10-04 (×16): 30 mg via ORAL
  Filled 2019-09-30 (×17): qty 1

## 2019-09-30 MED ORDER — AMIODARONE HCL 200 MG PO TABS
400.0000 mg | ORAL_TABLET | Freq: Two times a day (BID) | ORAL | Status: DC
  Administered 2019-09-30 – 2019-10-06 (×14): 400 mg via ORAL
  Filled 2019-09-30 (×14): qty 2

## 2019-09-30 MED ORDER — MAGNESIUM OXIDE 400 (241.3 MG) MG PO TABS
400.0000 mg | ORAL_TABLET | Freq: Two times a day (BID) | ORAL | Status: DC
  Administered 2019-09-30 – 2019-10-06 (×13): 400 mg via ORAL
  Filled 2019-09-30 (×14): qty 1

## 2019-09-30 MED ORDER — METOPROLOL TARTRATE 5 MG/5ML IV SOLN
INTRAVENOUS | Status: AC
  Filled 2019-09-30: qty 5

## 2019-09-30 MED ORDER — AMIODARONE LOAD VIA INFUSION
150.0000 mg | Freq: Once | INTRAVENOUS | Status: DC
  Filled 2019-09-30: qty 83.34

## 2019-09-30 MED ORDER — K PHOS MONO-SOD PHOS DI & MONO 155-852-130 MG PO TABS
500.0000 mg | ORAL_TABLET | Freq: Three times a day (TID) | ORAL | Status: DC
  Administered 2019-09-30 – 2019-10-01 (×3): 500 mg via ORAL
  Filled 2019-09-30 (×4): qty 2

## 2019-09-30 MED ORDER — METOPROLOL TARTRATE 5 MG/5ML IV SOLN
5.0000 mg | Freq: Once | INTRAVENOUS | Status: AC
  Administered 2019-09-30: 5 mg via INTRAVENOUS

## 2019-09-30 MED ORDER — AMIODARONE IV BOLUS ONLY 150 MG/100ML
150.0000 mg | Freq: Once | INTRAVENOUS | Status: AC
  Administered 2019-09-30: 150 mg via INTRAVENOUS
  Filled 2019-09-30: qty 100

## 2019-09-30 MED ORDER — HEPARIN (PORCINE) 25000 UT/250ML-% IV SOLN
1150.0000 [IU]/h | INTRAVENOUS | Status: AC
  Administered 2019-09-30 – 2019-10-01 (×2): 900 [IU]/h via INTRAVENOUS
  Administered 2019-10-02: 13:00:00 1150 [IU]/h via INTRAVENOUS
  Filled 2019-09-30 (×3): qty 250

## 2019-09-30 MED ORDER — POTASSIUM CHLORIDE CRYS ER 20 MEQ PO TBCR
40.0000 meq | EXTENDED_RELEASE_TABLET | Freq: Once | ORAL | Status: AC
  Administered 2019-09-30: 40 meq via ORAL
  Filled 2019-09-30: qty 2

## 2019-09-30 MED ORDER — AMIODARONE HCL IN DEXTROSE 360-4.14 MG/200ML-% IV SOLN
30.0000 mg/h | INTRAVENOUS | Status: DC
  Filled 2019-09-30: qty 200

## 2019-09-30 MED ORDER — POTASSIUM CHLORIDE CRYS ER 20 MEQ PO TBCR
20.0000 meq | EXTENDED_RELEASE_TABLET | ORAL | Status: DC
  Administered 2019-09-30: 20 meq via ORAL
  Filled 2019-09-30: qty 1

## 2019-09-30 MED ORDER — HEPARIN BOLUS VIA INFUSION
2000.0000 [IU] | Freq: Once | INTRAVENOUS | Status: AC
  Administered 2019-09-30: 10:00:00 2000 [IU] via INTRAVENOUS
  Filled 2019-09-30: qty 2000

## 2019-09-30 NOTE — Progress Notes (Signed)
[facility] Progress Note Patient Name: Summer Goodman DOB: [birth year] MRN: [id number]   Date of Service  09/30/2019  HPI/Events of Note  K+ 3.1, GFR 23  eICU Interventions  KCL 20 meq Q 4 hours x 2 doses        Anea Fodera U Eddy Termine 09/30/2019, 6:45 AM

## 2019-09-30 NOTE — Progress Notes (Signed)
ANTICOAGULATION CONSULT NOTE - Follow Up Consult  Pharmacy Consult for Heparin Indication: atrial fibrillation  Allergies  Allergen Reactions  . Metformin And Related Diarrhea    Patient Measurements: Height: 5' (152.4 cm) Weight: 147 lb 14.9 oz (67.1 kg) IBW/kg (Calculated) : 45.5 Heparin Dosing Weight: 60.5 kg  Vital Signs: Temp: 98.1 F (36.7 C) (11/17 1500) Temp Source: Axillary (11/17 1500) BP: 133/49 (11/17 1800) Pulse Rate: 86 (11/17 1800)  Labs: Recent Labs    09/28/19 0356 09/29/19 0351 09/29/19 0946 09/30/19 0335 09/30/19 1609 09/30/19 1848  HGB 12.7  --  12.7 11.7*  --   --   HCT 39.7  --  38.1 35.3*  --   --   PLT 186  --  197 202  --   --   HEPARINUNFRC  --   --   --   --   --  0.48  CREATININE 4.85* 3.48*  --  2.05* 1.73*  --     Estimated Creatinine Clearance: 24.4 mL/min (A) (by C-G formula based on SCr of 1.73 mg/dL (H)).   Medical History: Past Medical History:  Diagnosis Date  . Dementia ([place])   . Diabetes ([place])   . Hyperlipidemia   . Hypertension   . Osteoporosis     Medications:  Scheduled:  . amiodarone  400 mg Oral BID  . Chlorhexidine Gluconate Cloth  6 each Topical Daily  . diltiazem  30 mg Oral Q6H  . insulin aspart  0-5 Units Subcutaneous QHS  . insulin aspart  0-9 Units Subcutaneous TID WC  . magnesium oxide  400 mg Oral BID  . PARoxetine  20 mg Oral Daily  . phosphorus  500 mg Oral TID WC & HS   Infusions:  . heparin 900 Units/hr (09/30/19 0941)    Assessment: 73 y.o. female with a hx of hypertension, hyperlipidemia, diabetes type 2 and dementia who is being seen for the evaluation of atrial fibrillation with RVR. No anticoagulation PTA.  Pharmacy consulted for heparin dosing.   Hgb 11.7, platelets 202  Initial heparin level (drawn ~9 hrs after heparin 2000 units IV bolus followed by heparin infusion at 900 units/hr) is 0.48 units/ml, which is within the desired goal range. Per RN, no issues with IV or bleeding  observed.  Goal of Therapy:  Heparin level 0.3-0.7 units/ml Monitor platelets by anticoagulation protocol: Yes   Plan:  Continue heparin infusion at 900 units/htr Check confirmatory heparin level in 8 hrs Monitor daily heparin level and CBC Monitor for signs/symptoms of bleeding  Gillermina Hu, PharmD, BCPS, [facility] Clinical Pharmacist 09/30/19, 19:36 PM

## 2019-09-30 NOTE — Progress Notes (Addendum)
Progress Note  Patient Name: Summer Goodman Date of Encounter: 09/30/2019  Primary Cardiologist: No primary care provider on file. new- will need f/u in [place] office  Subjective   Had recurrent episodes of SVT and AFib overnight.  Inpatient Medications    Scheduled Meds: . amiodarone  400 mg Oral BID  . Chlorhexidine Gluconate Cloth  6 each Topical Daily  . diltiazem  30 mg Oral Q6H  . insulin aspart  0-5 Units Subcutaneous QHS  . insulin aspart  0-9 Units Subcutaneous TID WC  . magnesium oxide  400 mg Oral BID  . PARoxetine  20 mg Oral Daily  . potassium phosphate (monobasic)  500 mg Oral TID WC & HS   Continuous Infusions: . heparin 900 Units/hr (09/30/19 0941)   PRN Meds: clonazePAM   Vital Signs    Vitals:   09/30/19 0700 09/30/19 0732 09/30/19 0800 09/30/19 0900  BP: (!) 133/51  (!) 140/54 (!) 148/64  Pulse: 76  80 93  Resp: 20  16 (!) 26  Temp: 100 F (37.8 C)     TempSrc: Axillary     SpO2: 93% 93% 94% 92%  Weight:      Height:        Intake/Output Summary (Last 24 hours) at 09/30/2019 1009 Last data filed at 09/30/2019 0800 Gross per 24 hour  Intake 473.07 ml  Output 1249 ml  Net -775.93 ml   Last 3 Weights 09/30/2019 09/29/2019 09/28/2019  Weight (lbs) 147 lb 14.9 oz 147 lb 0.8 oz 157 lb 6.5 oz  Weight (kg) 67.1 kg 66.7 kg 71.4 kg      Telemetry    NSR, PAF, PSVT- Personally Reviewed  ECG      Physical Exam   GEN: No acute distress.  Frail Neck: No JVD Cardiac: RRR, no murmurs, rubs, or gallops.  Respiratory: Clear to auscultation bilaterally. GI: Soft, nontender, non-distended  MS: No edema; No deformity. Neuro:  Nonfocal  Psych: Normal affect   Labs    High Sensitivity Troponin:  No results for input(s): TROPONINIHS in the last 720 hours.    Chemistry Recent Labs  Lab 09/28/19 0356 09/29/19 0351 09/30/19 0335  NA 141 140 140  K 3.4* 3.3* 3.1*  CL 105 100 100  CO2 23 25 24   GLUCOSE 148* 238* 213*  BUN 42* 33*  22  CREATININE 4.85* 3.48* 2.05*  CALCIUM 7.8* 8.3* 8.1*  PROT 5.0*  --   --   ALBUMIN 2.2* 2.1* 2.1*  AST 19  --   --   ALT 16  --   --   ALKPHOS 75  --   --   BILITOT 0.5  --   --   GFRNONAA 8* 12* 23*  GFRAA 10* 14* 27*  ANIONGAP 13 15 16*     Hematology Recent Labs  Lab 09/28/19 0356 09/29/19 0946 09/30/19 0335  WBC 15.3* 15.0* 13.5*  RBC 4.08 4.03 3.72*  HGB 12.7 12.7 11.7*  HCT 39.7 38.1 35.3*  MCV 97.3 94.5 94.9  MCH 31.1 31.5 31.5  MCHC 32.0 33.3 33.1  RDW 15.9* 15.5 15.2  PLT 186 197 202    BNPNo results for input(s): BNP, PROBNP in the last 168 hours.   DDimer No results for input(s): DDIMER in the last 168 hours.   Radiology    US Renal  Result Date: 09/28/2019 CLINICAL DATA:  Initial evaluation for acute renal insufficiency. EXAM: RENAL / URINARY TRACT ULTRASOUND COMPLETE COMPARISON:  Prior CT from 09/25/2019.  FINDINGS: Right Kidney: Renal measurements: 11.0 x 3.9 x 4.6 cm = volume: 101.1 mL. Echogenicity within normal limits. No nephrolithiasis or hydronephrosis. 9 mm echogenic lesion within the mid-lower right kidney consistent with previously identified small AML. Small extrarenal pelvis noted. Left Kidney: Renal measurements: 11.0 x 4.2 x 3.3 cm = volume: 78.8 mL. Echogenicity within normal limits. No mass or hydronephrosis visualized. Bladder: Decompressed with a Foley catheter in place. Other: None. IMPRESSION: 1. No hydronephrosis or other acute finding. 2. 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 Electronically Signed   By: Jeannine Boga M.D.   On: 09/28/2019 13:44   Dg Chest Port 1 View  Result Date: 09/30/2019 CLINICAL DATA:  Dyspnea, weakness EXAM: PORTABLE CHEST 1 VIEW COMPARISON:  09/28/2019 FINDINGS: Multifocal interstitial/patchy opacities in the lungs bilaterally. Moderate left pleural effusion, grossly unchanged. Associated left lower lobe atelectasis. No pneumothorax. Cardiomegaly.  Thoracic aortic  atherosclerosis. IMPRESSION: Multifocal interstitial/patchy opacities in the lungs bilaterally. Given associated moderate left pleural effusion and cardiomegaly, this appearance likely reflects moderate interstitial edema. However, in the appropriate clinical setting, multifocal infection/pneumonia is not excluded. Electronically Signed   By: Julian Hy M.D.   On: 09/30/2019 10:05    Cardiac Studies   Normal echo essentially from [facility]  Patient Profile     73 y.o. female with PAF  Assessment & Plan    1) PAF:  Amio gtt restarted along with Cardizem gtt.  Will switch both to oral as she is maintaining NSR at this time.  Start IV heparin per pharmacy for stroke prevention.  Will have to make a decision about longterm anticoagulation once she is more stable, at the time of discharge.  AKI improving.      For questions or updates, please contact [place] Please consult [email or web address] for contact info under        Signed, Larae Grooms, MD  09/30/2019, 10:09 AM

## 2019-09-30 NOTE — Progress Notes (Signed)
ANTICOAGULATION CONSULT NOTE - Initial Consult  Pharmacy Consult for Heparin Indication: atrial fibrillation  Allergies  Allergen Reactions  . Metformin And Related Diarrhea    Patient Measurements: Height: 5' (152.4 cm) Weight: 147 lb 14.9 oz (67.1 kg) IBW/kg (Calculated) : 45.5 Heparin Dosing Weight: 60.5 kg  Vital Signs: Temp: 100 F (37.8 C) (11/17 0700) Temp Source: Axillary (11/17 0700) BP: 133/51 (11/17 0700) Pulse Rate: 76 (11/17 0700)  Labs: Recent Labs    09/28/19 0356 09/29/19 0351 09/29/19 0946 09/30/19 0335  HGB 12.7  --  12.7 11.7*  HCT 39.7  --  38.1 35.3*  PLT 186  --  197 202  CREATININE 4.85* 3.48*  --  2.05*    Estimated Creatinine Clearance: 20.6 mL/min (A) (by C-G formula based on SCr of 2.05 mg/dL (H)).   Medical History: Past Medical History:  Diagnosis Date  . Dementia ([place])   . Diabetes ([place])   . Hyperlipidemia   . Hypertension   . Osteoporosis     Medications:  Scheduled:  . amiodarone  400 mg Oral BID  . Chlorhexidine Gluconate Cloth  6 each Topical Daily  . diltiazem  30 mg Oral Q6H  . heparin  5,000 Units Subcutaneous Q8H  . insulin aspart  0-5 Units Subcutaneous QHS  . insulin aspart  0-9 Units Subcutaneous TID WC  . magnesium oxide  400 mg Oral BID  . PARoxetine  20 mg Oral Daily  . potassium phosphate (monobasic)  500 mg Oral TID WC & HS   Infusions:    Assessment: 73 y.o. female with a hx of hypertension, hyperlipidemia, diabetes type 2 and dementia who is being seen for the evaluation of atrial fibrillation with RVR. No AC PTA. Patient is currently on PO dilt and PO amio for afib. Pharmacy consulted for heparin dosing. Hgb 11.7, Plts 202  Goal of Therapy:  Heparin level 0.3-0.7 units/ml Monitor platelets by anticoagulation protocol: Yes   Plan:  Heparin bolus 2000 units Heparin gtt at 900 units/htr Check 8hr HL Daily HL and CBC Monitor s/sx of bleeding  Lorel Monaco, PharmD PGY1 Ambulatory Care  Resident [employer] # [phone]

## 2019-09-30 NOTE — Progress Notes (Signed)
eLink Physician-Brief Progress Note Patient Name: Summer Goodman DOB: 26-Jan-1946 MRN: [id number]   Date of Service  09/30/2019  HPI/Events of Note  Afib with RVR  eICU Interventions  Amiodarone 150 mg iv bolus then continue Amiodarone infusion.        [name] 09/30/2019, 2:20 AM

## 2019-09-30 NOTE — Progress Notes (Signed)
[facility]  PROGRESS NOTE    Summer Goodman  [id number] DOB: [birth year] DOA: 09/28/2019 PCP: Patient, No Pcp Per   Brief Narrative:   Admitted on transfer from [facility] overnight. Had been diagnosed with a UTI and treated with bactrim as an outpatient, then developed worsening malaise, weakness, reduced PO intake. Went to [facility] and found to have a left pleural effusion and AKI. Effusion was tapped there. V/Q scan showed low prob PE, TTE showed EF 60-65%.  Transferred to our facility for further evaluation of ongoing renal failure.  11/16: In a fib RVR this AM despite amiondarone gtt and IV metoprolol scheduled. 11/17: Still on HFNC. Amio ggt resumed by night CCM. Appreciate all consultants assistance.    Assessment & Plan:   Active Problems:   AKI (acute kidney injury) ([place])   Acute respiratory failure with hypoxia (HCC)   Acute cystitis without hematuria  Non-oliguric Acute Renal Failure     - followed by nephro; have now s/o'd     - UOP ok     - SCr is down to 2.05     - Continue to monitor  Acute Hypoxic Respiratory Failure with L pleural Effusion     - transitioned from non-rebreather mask to HFNC     - CT chest from 11/14 with L rounded pleural based opacity in the lingula, underlying mass not excluded, no imaging CD sent over      - pleural effusion tapped at [facility] per PCCM; follow up thoracentesis results     - abx held per PCCM     - procal 0.32; RVP is negative; continue to hold abx for now     - remains on HFNC; wean as able, rpt CXR noted, lasix?; requested results from [facility]     - spoke with PCCM; get results from [facility] before rpt tap  Pyuria     - not severely septic and obstructive uropathy     - continue Foley     - abx stopped by PCCM  Type 2 DM     - Hold home Glipizide and Actos     - continue SSI     - glucose is acceptable  HTN     - Normotensive on arrival     - home Lisinopril, Norvasc, Lopressor, HCTZ on hold     - monitor BP for  resumption of meds  HLD     - home lovastatin on hold  Dementia Depression     - home regimen is klonopin and paxil; continue  Hypokalemia Hypomagenesemia     - replace, monitor  A fib RVR     - started in A fib yesterday and was placed on amiodarone gtt, metoprolol 5mg  IV q6h     - still with rates into 160-170s this AM     - given booster dose of IV metoprolol with no effect     - started diltiazem gtt in addition to the amiodarone; metoprolol d/c'd     - consult cards, appreciate assistance  DVT prophylaxis: heparin Code Status: FULL   Disposition Plan: TBD  Consultants:   Cardiology  Neprhology  PCCM  ROS:  Reports dyspnea, tiredness. Denies palpitations, CP . Remainder 10-pt ROS is negative for all not previously mentioned.  Subjective:  She says she is feeling tired, but ok.  Objective: Vitals:   09/30/19 1300 09/30/19 1400 09/30/19 1419 09/30/19 1500  BP: 125/63 (!) 128/48  (!) 138/52  Pulse: 79 78  75  Resp:  15 17  (!) 22  Temp:      TempSrc:      SpO2: 93% 93% 95% 93%  Weight:      Height:        Intake/Output Summary (Last 24 hours) at 09/30/2019 1522 Last data filed at 09/30/2019 1145 Gross per 24 hour  Intake 365.36 ml  Output 1140 ml  Net -774.64 ml   Filed Weights   09/28/19 1029 09/29/19 0445 09/30/19 0500  Weight: 71.4 kg 66.7 kg 67.1 kg    Examination:  General: 73 y.o. ill appearing female resting in bed in NAD Cardiovascular: irregular, +S1, S2, no m/g/r, equal pulses throughout Respiratory: soft rhonchi bilat, no wheeze, normal WOB GI: BS+, NDNT, soft MSK: No e/c/c Neuro: Alert to name, follows commands Psyc: calm/cooperative   Data Reviewed: I have personally reviewed following labs and imaging studies.  CBC: Recent Labs  Lab 09/28/19 0335 09/28/19 0356 09/29/19 0946 09/30/19 0335  WBC  --  15.3* 15.0* 13.5*  NEUTROABS  --  12.9* 12.4* 10.8*  HGB 11.9* 12.7 12.7 11.7*  HCT 35.0* 39.7 38.1 35.3*  MCV  --   97.3 94.5 94.9  PLT  --  186 197 [id number]   Basic Metabolic Panel: Recent Labs  Lab 09/28/19 0335 09/28/19 0356 09/29/19 0351 09/29/19 0946 09/30/19 0335  NA 140 141 140  --  140  K 3.2* 3.4* 3.3*  --  3.1*  CL  --  105 100  --  100  CO2  --  23 25  --  24  GLUCOSE  --  148* 238*  --  213*  BUN  --  42* 33*  --  22  CREATININE  --  4.85* 3.48*  --  2.05*  CALCIUM  --  7.8* 8.3*  --  8.1*  MG  --  1.7  --  1.6* 1.5*  PHOS  --  4.1 2.8  --  2.1*   GFR: Estimated Creatinine Clearance: 20.6 mL/min (A) (by C-G formula based on SCr of 2.05 mg/dL (H)). Liver Function Tests: Recent Labs  Lab 09/28/19 0356 09/29/19 0351 09/30/19 0335  AST 19  --   --   ALT 16  --   --   ALKPHOS 75  --   --   BILITOT 0.5  --   --   PROT 5.0*  --   --   ALBUMIN 2.2* 2.1* 2.1*   No results for input(s): LIPASE, AMYLASE in the last 168 hours. No results for input(s): AMMONIA in the last 168 hours. Coagulation Profile: No results for input(s): INR, PROTIME in the last 168 hours. Cardiac Enzymes: No results for input(s): CKTOTAL, CKMB, CKMBINDEX, TROPONINI in the last 168 hours. BNP (last 3 results) No results for input(s): PROBNP in the last 8760 hours. HbA1C: Recent Labs    09/28/19 0356  HGBA1C 5.8*   CBG: Recent Labs  Lab 09/29/19 1500 09/29/19 1925 09/29/19 2157 09/30/19 0728 09/30/19 1137  GLUCAP 250* 170* 190* 218* 163*   Lipid Profile: No results for input(s): CHOL, HDL, LDLCALC, TRIG, CHOLHDL, LDLDIRECT in the last 72 hours. Thyroid Function Tests: No results for input(s): TSH, T4TOTAL, FREET4, T3FREE, THYROIDAB in the last 72 hours. Anemia Panel: No results for input(s): VITAMINB12, FOLATE, FERRITIN, TIBC, IRON, RETICCTPCT in the last 72 hours. Sepsis Labs: Recent Labs  Lab 09/28/19 0356 09/28/19 0738  PROCALCITON 0.32  --   LATICACIDVEN 0.9 0.9    Recent Results (from the past 240 hour(s))  MRSA  PCR Screening     Status: None   Collection Time: 09/28/19  2:51 AM    Specimen: Nasal Mucosa; Nasopharyngeal  Result Value Ref Range Status   MRSA by PCR NEGATIVE NEGATIVE Final    Comment:        The GeneXpert MRSA Assay (FDA approved for NASAL specimens only), is one component of a comprehensive MRSA colonization surveillance program. It is not intended to diagnose MRSA infection nor to guide or monitor treatment for MRSA infections. Performed at [facility], [place] [address] [place], [place] [address]   Respiratory Panel by PCR     Status: None   Collection Time: 09/28/19  5:00 AM   Specimen: Nasopharyngeal Swab; Respiratory  Result Value Ref Range Status   Adenovirus NOT DETECTED NOT DETECTED Final   Coronavirus 229E NOT DETECTED NOT DETECTED Final    Comment: (NOTE) The Coronavirus on the Respiratory Panel, DOES NOT test for the novel  Coronavirus (2019 nCoV)    Coronavirus HKU1 NOT DETECTED NOT DETECTED Final   Coronavirus NL63 NOT DETECTED NOT DETECTED Final   Coronavirus OC43 NOT DETECTED NOT DETECTED Final   Metapneumovirus NOT DETECTED NOT DETECTED Final   Rhinovirus / Enterovirus NOT DETECTED NOT DETECTED Final   Influenza A NOT DETECTED NOT DETECTED Final   Influenza B NOT DETECTED NOT DETECTED Final   Parainfluenza Virus 1 NOT DETECTED NOT DETECTED Final   Parainfluenza Virus 2 NOT DETECTED NOT DETECTED Final   Parainfluenza Virus 3 NOT DETECTED NOT DETECTED Final   Parainfluenza Virus 4 NOT DETECTED NOT DETECTED Final   Respiratory Syncytial Virus NOT DETECTED NOT DETECTED Final   Bordetella pertussis NOT DETECTED NOT DETECTED Final   Chlamydophila pneumoniae NOT DETECTED NOT DETECTED Final   Mycoplasma pneumoniae NOT DETECTED NOT DETECTED Final    Comment: Performed at [facility], [place]. [address], [place], [place] [address]      Radiology Studies: Dg Chest Port 1 View  Result Date: 09/30/2019 CLINICAL DATA:  Dyspnea, weakness EXAM: PORTABLE CHEST 1 VIEW COMPARISON:  09/28/2019 FINDINGS: Multifocal  interstitial/patchy opacities in the lungs bilaterally. Moderate left pleural effusion, grossly unchanged. Associated left lower lobe atelectasis. No pneumothorax. Cardiomegaly.  Thoracic aortic atherosclerosis. IMPRESSION: Multifocal interstitial/patchy opacities in the lungs bilaterally. Given associated moderate left pleural effusion and cardiomegaly, this appearance likely reflects moderate interstitial edema. However, in the appropriate clinical setting, multifocal infection/pneumonia is not excluded. Electronically Signed   By: Julian Hy M.D.   On: 09/30/2019 10:05     Scheduled Meds: . amiodarone  400 mg Oral BID  . Chlorhexidine Gluconate Cloth  6 each Topical Daily  . diltiazem  30 mg Oral Q6H  . insulin aspart  0-5 Units Subcutaneous QHS  . insulin aspart  0-9 Units Subcutaneous TID WC  . magnesium oxide  400 mg Oral BID  . PARoxetine  20 mg Oral Daily  . phosphorus  500 mg Oral TID WC & HS   Continuous Infusions: . heparin 900 Units/hr (09/30/19 0941)  . magnesium sulfate bolus IVPB 2 g (09/30/19 1444)     LOS: 2 days    Time spent: 35 minutes spent in the coordination of care today.    Jonnie Finner, DO [facility] Pager [phone]  If 7PM-7AM, please contact night-coverage [email or web address] Password [email or web address] 09/30/2019, 3:22 PM

## 2019-09-30 NOTE — Progress Notes (Signed)
Patient in Afib with RVR. Obtained EKG and confirmed. Called MD. Giving Amiodarone 150 mg bolus and restarting infusion per orders. Will continue to monitor.   Jackalyn Lombard

## 2019-10-01 DIAGNOSIS — I10 Essential (primary) hypertension: Secondary | ICD-10-CM

## 2019-10-01 LAB — RENAL FUNCTION PANEL
Albumin: 2 g/dL — ABNORMAL LOW (ref 3.5–5.0)
Anion gap: 11 (ref 5–15)
BUN: 16 mg/dL (ref 8–23)
CO2: 27 mmol/L (ref 22–32)
Calcium: 8.1 mg/dL — ABNORMAL LOW (ref 8.9–10.3)
Chloride: 101 mmol/L (ref 98–111)
Creatinine, Ser: 1.5 mg/dL — ABNORMAL HIGH (ref 0.44–1.00)
GFR calc Af Amer: 39 mL/min — ABNORMAL LOW (ref >60)
GFR calc non Af Amer: 34 mL/min — ABNORMAL LOW (ref >60)
Glucose, Bld: 154 mg/dL — ABNORMAL HIGH (ref 70–99)
Phosphorus: 2.7 mg/dL (ref 2.5–4.6)
Potassium: 3.3 mmol/L — ABNORMAL LOW (ref 3.5–5.1)
Sodium: 139 mmol/L (ref 135–145)

## 2019-10-01 LAB — GLUCOSE, CAPILLARY
Glucose-Capillary: 166 mg/dL — ABNORMAL HIGH (ref 70–99)
Glucose-Capillary: 179 mg/dL — ABNORMAL HIGH (ref 70–99)
Glucose-Capillary: 160 mg/dL — ABNORMAL HIGH (ref 70–99)
Glucose-Capillary: 177 mg/dL — ABNORMAL HIGH (ref 70–99)

## 2019-10-01 LAB — CBC
HCT: 32.7 % — ABNORMAL LOW (ref 36.0–46.0)
Hemoglobin: 10.7 g/dL — ABNORMAL LOW (ref 12.0–15.0)
MCH: 30.8 pg (ref 26.0–34.0)
MCHC: 32.7 g/dL (ref 30.0–36.0)
MCV: 94.2 fL (ref 80.0–100.0)
Platelets: 210 10*3/uL (ref 150–400)
RBC: 3.47 MIL/uL — ABNORMAL LOW (ref 3.87–5.11)
RDW: 15.1 % (ref 11.5–15.5)
WBC: 13.4 10*3/uL — ABNORMAL HIGH (ref 4.0–10.5)
nRBC: 0 % (ref 0.0–0.2)

## 2019-10-01 LAB — HEPARIN LEVEL (UNFRACTIONATED): Heparin Unfractionated: 0.32 IU/mL (ref 0.30–0.70)

## 2019-10-01 LAB — MAGNESIUM: Magnesium: 2.1 mg/dL (ref 1.7–2.4)

## 2019-10-01 MED ORDER — POTASSIUM CHLORIDE CRYS ER 20 MEQ PO TBCR
20.0000 meq | EXTENDED_RELEASE_TABLET | Freq: Two times a day (BID) | ORAL | Status: DC
  Administered 2019-10-01 – 2019-10-06 (×11): 20 meq via ORAL
  Filled 2019-10-01 (×5): qty 1
  Filled 2019-10-01: qty 2
  Filled 2019-10-01 (×5): qty 1

## 2019-10-01 NOTE — Progress Notes (Signed)
Patient went into A Fib RVR with rate in 170's. We asked her to bare down and the rhythm normalized. She is currently in normal sinus rhythm. I paged [facility] to make the MD aware. Will continue to monitor.

## 2019-10-01 NOTE — Progress Notes (Signed)
Progress Note  Patient Name: Summer Goodman Date of Encounter: 10/01/2019  Primary Cardiologist: No primary care provider on file. [facility]  Subjective   Feels ok  Inpatient Medications    Scheduled Meds: . amiodarone  400 mg Oral BID  . Chlorhexidine Gluconate Cloth  6 each Topical Daily  . diltiazem  30 mg Oral Q6H  . insulin aspart  0-5 Units Subcutaneous QHS  . insulin aspart  0-9 Units Subcutaneous TID WC  . magnesium oxide  400 mg Oral BID  . PARoxetine  20 mg Oral Daily  . potassium chloride  20 mEq Oral BID   Continuous Infusions: . heparin 900 Units/hr (10/01/19 1108)   PRN Meds: [email or web address]   Vital Signs    Vitals:   10/01/19 0900 10/01/19 1000 10/01/19 1100 10/01/19 1154  BP: (!) 142/59 (!) 148/55 (!) 156/58   Pulse: 76 85 80   Resp: 17 (!) 23 17   Temp:    97.8 F (36.6 C)  TempSrc:    Oral  SpO2: 93% 93% 92%   Weight:      Height:        Intake/Output Summary (Last 24 hours) at 10/01/2019 1214 Last data filed at 10/01/2019 1000 Gross per 24 hour  Intake 818.1 ml  Output 830 ml  Net -11.9 ml   Last 3 Weights 10/01/2019 09/30/2019 09/29/2019  Weight (lbs) 148 lb 9.4 oz 147 lb 14.9 oz 147 lb 0.8 oz  Weight (kg) 67.4 kg 67.1 kg 66.7 kg      Telemetry    Had AFib earlier today - Personally Reviewed  ECG      Physical Exam   GEN: No acute distress.  Frail Neck: No JVD Cardiac: RRR, no murmurs, rubs, or gallops.  Respiratory: Clear to auscultation bilaterally. GI: Soft, nontender, non-distended  MS: No edema; No deformity. Neuro:  Nonfocal  Psych: Normal affect   Labs    High Sensitivity Troponin:  No results for input(s): TROPONINIHS in the last 720 hours.    Chemistry Recent Labs  Lab 09/28/19 0356 09/29/19 0351 09/30/19 0335 09/30/19 1609 10/01/19 0211  NA 141 140 140 136 139  K 3.4* 3.3* 3.1* 3.1* 3.3*  CL 105 100 100 98 101  CO2 23 25 24 27 27   GLUCOSE 148* 238* 213* 191* 154*  BUN 42* 33* 22 18 16    CREATININE 4.85* 3.48* 2.05* 1.73* 1.50*  CALCIUM 7.8* 8.3* 8.1* 7.9* 8.1*  PROT 5.0*  --   --   --   --   ALBUMIN 2.2* 2.1* 2.1*  --  2.0*  AST 19  --   --   --   --   ALT 16  --   --   --   --   ALKPHOS 75  --   --   --   --   BILITOT 0.5  --   --   --   --   GFRNONAA 8* 12* 23* 29* 34*  GFRAA 10* 14* 27* 33* 39*  ANIONGAP 13 15 16* 11 11     Hematology Recent Labs  Lab 09/29/19 0946 09/30/19 0335 10/01/19 0211  WBC 15.0* 13.5* 13.4*  RBC 4.03 3.72* 3.47*  HGB 12.7 11.7* 10.7*  HCT 38.1 35.3* 32.7*  MCV 94.5 94.9 94.2  MCH 31.5 31.5 30.8  MCHC 33.3 33.1 32.7  RDW 15.5 15.2 15.1  PLT 197 202 210    BNPNo results for input(s): BNP, PROBNP in the last 168 hours.  DDimer No results for input(s): DDIMER in the last 168 hours.   Radiology    Dg Chest Port 1 View  Result Date: 09/30/2019 CLINICAL DATA:  Dyspnea, weakness EXAM: PORTABLE CHEST 1 VIEW COMPARISON:  09/28/2019 FINDINGS: Multifocal interstitial/patchy opacities in the lungs bilaterally. Moderate left pleural effusion, grossly unchanged. Associated left lower lobe atelectasis. No pneumothorax. Cardiomegaly.  Thoracic aortic atherosclerosis. IMPRESSION: Multifocal interstitial/patchy opacities in the lungs bilaterally. Given associated moderate left pleural effusion and cardiomegaly, this appearance likely reflects moderate interstitial edema. However, in the appropriate clinical setting, multifocal infection/pneumonia is not excluded. Electronically Signed   By: Julian Hy M.D.   On: 09/30/2019 10:05    Cardiac Studies   Normal EF by echo  Patient Profile     73 y.o. female with PAF  Assessment & Plan    1) PAF: Continue Amiodarone. IV heparin for now.  Will likely need DOAC once Cr stabilizes. 2) COntinue Cardizem.  Can start to add back antihypertensives as BP is stable as well.        For questions or updates, please contact [place] Please consult [email or web address] for contact info under         Signed, Larae Grooms, MD  10/01/2019, 12:14 PM

## 2019-10-01 NOTE — Progress Notes (Signed)
Spoke to microbiology department at [facility].  Urine culture is negative Blood culture at 24 hours was negative, they do a 24 hr blood culture check and a 5 day Final check - will have to call back tomorrow 11/19 for Final result.

## 2019-10-01 NOTE — Progress Notes (Addendum)
Progress Note  Patient Name: Summer Goodman Date of Encounter: 10/01/2019  Primary Cardiologist: No primary care provider on file. need to arrange with one of our cardiologist in [place]   Subjective   No chest pain - dyspnea improved  Inpatient Medications    Scheduled Meds: . amiodarone  400 mg Oral BID  . Chlorhexidine Gluconate Cloth  6 each Topical Daily  . diltiazem  30 mg Oral Q6H  . insulin aspart  0-5 Units Subcutaneous QHS  . insulin aspart  0-9 Units Subcutaneous TID WC  . magnesium oxide  400 mg Oral BID  . PARoxetine  20 mg Oral Daily  . potassium chloride  20 mEq Oral BID   Continuous Infusions: . heparin 900 Units/hr (10/01/19 1108)   PRN Meds: [email or web address]   Vital Signs    Vitals:   10/01/19 0900 10/01/19 1000 10/01/19 1100 10/01/19 1154  BP: (!) 142/59 (!) 148/55 (!) 156/58   Pulse: 76 85 80   Resp: 17 (!) 23 17   Temp:    97.8 F (36.6 C)  TempSrc:    Oral  SpO2: 93% 93% 92%   Weight:      Height:        Intake/Output Summary (Last 24 hours) at 10/01/2019 1203 Last data filed at 10/01/2019 1000 Gross per 24 hour  Intake 818.1 ml  Output 830 ml  Net -11.9 ml   Last 3 Weights 10/01/2019 09/30/2019 09/29/2019  Weight (lbs) 148 lb 9.4 oz 147 lb 14.9 oz 147 lb 0.8 oz  Weight (kg) 67.4 kg 67.1 kg 66.7 kg      Telemetry    SR with freq PACs and oc short burst of rapid SVT - Personally Reviewed  ECG    No new - Personally Reviewed  Physical Exam   GEN: No acute distress.   Neck: No JVD Cardiac: RRR, soft murmur, no rubs, or gallops.  Respiratory: Clear to diminished to auscultation bilaterally. GI: Soft, nontender, non-distended  MS: No edema; No deformity. Neuro:  Nonfocal  Psych: Normal affect   Labs    High Sensitivity Troponin:  No results for input(s): TROPONINIHS in the last 720 hours.    Chemistry Recent Labs  Lab 09/28/19 0356 09/29/19 0351 09/30/19 0335 09/30/19 1609 10/01/19 0211  NA 141 140 140 136 139  K  3.4* 3.3* 3.1* 3.1* 3.3*  CL 105 100 100 98 101  CO2 23 25 24 27 27   GLUCOSE 148* 238* 213* 191* 154*  BUN 42* 33* 22 18 16   CREATININE 4.85* 3.48* 2.05* 1.73* 1.50*  CALCIUM 7.8* 8.3* 8.1* 7.9* 8.1*  PROT 5.0*  --   --   --   --   ALBUMIN 2.2* 2.1* 2.1*  --  2.0*  AST 19  --   --   --   --   ALT 16  --   --   --   --   ALKPHOS 75  --   --   --   --   BILITOT 0.5  --   --   --   --   GFRNONAA 8* 12* 23* 29* 34*  GFRAA 10* 14* 27* 33* 39*  ANIONGAP 13 15 16* 11 11     Hematology Recent Labs  Lab 09/29/19 0946 09/30/19 0335 10/01/19 0211  WBC 15.0* 13.5* 13.4*  RBC 4.03 3.72* 3.47*  HGB 12.7 11.7* 10.7*  HCT 38.1 35.3* 32.7*  MCV 94.5 94.9 94.2  MCH 31.5 31.5 30.8  MCHC  33.3 33.1 32.7  RDW 15.5 15.2 15.1  PLT 197 202 210    BNPNo results for input(s): BNP, PROBNP in the last 168 hours.   DDimer No results for input(s): DDIMER in the last 168 hours.   Radiology    Dg Chest Port 1 View  Result Date: 09/30/2019 CLINICAL DATA:  Dyspnea, weakness EXAM: PORTABLE CHEST 1 VIEW COMPARISON:  09/28/2019 FINDINGS: Multifocal interstitial/patchy opacities in the lungs bilaterally. Moderate left pleural effusion, grossly unchanged. Associated left lower lobe atelectasis. No pneumothorax. Cardiomegaly.  Thoracic aortic atherosclerosis. IMPRESSION: Multifocal interstitial/patchy opacities in the lungs bilaterally. Given associated moderate left pleural effusion and cardiomegaly, this appearance likely reflects moderate interstitial edema. However, in the appropriate clinical setting, multifocal infection/pneumonia is not excluded. Electronically Signed   By: Julian Hy M.D.   On: 09/30/2019 10:05    Cardiac Studies   Essentially normal Echo from [place]  Patient Profile     73 y.o. female admitted with UTI and PAF   Assessment & Plan    1) PAF:  Amio po 400 mg BID and Cardizem 30 mg every 6 hours.  On IV heparin per pharmacy for stroke prevention.  Will have to make a  decision about longterm anticoagulation once she is more stable, at the time of discharge.  2) AKI improving. Cr today 1.47   3.  Hypokalemia per primary team        For questions or updates, please contact [place] Please consult [email or web address] for contact info under        Signed, Cecilie Kicks, NP  10/01/2019, 12:03 PM    I have examined the patient and reviewed assessment and plan and discussed with patient.  Agree with above as stated.    If getting close to discharge, would send home on Amio 400 mg daily. Maintained NSR overnight.  CArdizem CD 120 mg daily  Can stop IV heparin and start ELiquis 5 mg BID, as long as this dose is approved by pharmacy.   Will arrange f/u in [place] for her.    Larae Grooms

## 2019-10-01 NOTE — Progress Notes (Addendum)
ANTICOAGULATION CONSULT NOTE - Initial Consult  Pharmacy Consult for Heparin Indication: atrial fibrillation  Allergies  Allergen Reactions  . Metformin And Related Diarrhea    Patient Measurements: Height: 5' (152.4 cm) Weight: 148 lb 9.4 oz (67.4 kg) IBW/kg (Calculated) : 45.5 Heparin Dosing Weight: 60.5 kg  Vital Signs: Temp: 99.6 F (37.6 C) (11/18 0716) Temp Source: Axillary (11/18 0716) BP: 139/53 (11/18 0600) Pulse Rate: 76 (11/18 0600)  Labs: Recent Labs    09/29/19 0946 09/30/19 0335 09/30/19 1609 09/30/19 1848 10/01/19 0211  HGB 12.7 11.7*  --   --  10.7*  HCT 38.1 35.3*  --   --  32.7*  PLT 197 202  --   --  210  HEPARINUNFRC  --   --   --  0.48 0.32  CREATININE  --  2.05* 1.73*  --  1.50*    Estimated Creatinine Clearance: 28.2 mL/min (A) (by C-G formula based on SCr of 1.5 mg/dL (H)).   Medical History: Past Medical History:  Diagnosis Date  . Dementia ([place])   . Diabetes ([place])   . Hyperlipidemia   . Hypertension   . Osteoporosis     Medications:  Scheduled:  . amiodarone  400 mg Oral BID  . Chlorhexidine Gluconate Cloth  6 each Topical Daily  . diltiazem  30 mg Oral Q6H  . insulin aspart  0-5 Units Subcutaneous QHS  . insulin aspart  0-9 Units Subcutaneous TID WC  . magnesium oxide  400 mg Oral BID  . PARoxetine  20 mg Oral Daily  . phosphorus  500 mg Oral TID WC & HS   Infusions:  . heparin 900 Units/hr (09/30/19 0941)    Assessment: 73 y.o. female with a hx of hypertension, hyperlipidemia, diabetes type 2 and dementia who is being seen for the evaluation of atrial fibrillation with RVR. No AC PTA. Patient is currently on PO dilt and PO amio for afib. Pharmacy consulted for heparin dosing. Hgb 10.7, Plts 210'  Patient's 2nd Heparin level was therapeutic at 0.32  Goal of Therapy:  Heparin level 0.3-0.7 units/ml Monitor platelets by anticoagulation protocol: Yes   Plan:  Continue Heparin gtt at 900 units/htr Daily HL and  CBC Monitor s/sx of bleeding F/u oral anticoag discharge plans  Lorel Monaco, PharmD PGY1 Ambulatory Care Resident [employer] # [phone]

## 2019-10-01 NOTE — Progress Notes (Signed)
PROGRESS NOTE   Summer Goodman  [id number]    DOB: 1946-05-20    DOA: 09/28/2019  PCP: Patient, No Pcp Per   I have briefly reviewed patients previous medical records in [facility].  Chief Complaint: Acute renal failure  Brief Narrative:  73 year old female with PMH of dementia, HTN, HLD, type II DM, osteoporosis, diagnosed with UTI on 11/9 and started on Bactrim, subsequently developed malaise, weakness, decreased oral intake and presented to [facility] on 11/12 with acute kidney injury, creatinine ~4 with baseline <1, admitted at [facility], started on IV Zosyn, developed hypoxia and left pleural effusion, underwent thoracentesis 650 mL out, VQ scan with low probability for PE, TTE with LVEF 60-65%, noncontrast CT chest and CT abdomen pelvis negative for obstructive uropathy, due to worsening oxygen demand (10 L by [place]) and worsening renal failure, patient was transferred to [facility] on 11/15 for nephrology and critical care consult.  Stabilized and transferred to [facility] on 11/16.  Nephrology consulted.  Course complicated by A. fib with RVR, cardiology consulted.   Assessment & Plan:   Active Problems:   AKI (acute kidney injury) ([place])   Acute respiratory failure with hypoxia ([place])   Acute cystitis without hematuria   Acute kidney injury  Nephrology consulted and assisted with evaluation and management.  Nephrology signed off 11/16.  Acute kidney injury felt to be due to ACEI/dehydration/Bactrim causing ATN most likely.  UA without significant proteinuria or hematuria to suggest glomerular disease.  Creatinine had peaked to 4 at OSH.  Received a dose of IV Lasix on night of admission here.  Creatinine peaked to 4.8.  Creatinine continues to gradually improve, down to 1.5 today.  Continue to monitor BMP closely.  Volume overload  As suggested initially by left-sided pleural effusion and lower extremity edema.  TTE at outside facility showed  normal LVEF.  Received IV Lasix early on in admission.  Does have some upper extremity edema.  Continue auto diuresis without Lasix.  Recent UTI  UA here appears negative.  Paroxysmal A. fib with RVR  Cardiology assisting with management.  Amiodarone and Cardizem drip have been transitioned to oral.  She has been in sinus rhythm since early morning of 11/17, continue telemetry.  As per cardiology, continue IV heparin for now and will likely need DOAC once creatinine stabilizes.  Acute respiratory failure with hypoxia/left pleural effusion  PCCM consulted early on in admission.  Chest x-ray 11/17: Multifocal interstitial/patchy opacities in the lungs bilaterally.  Associated moderate left pleural effusion and cardiomegaly, reflects interstitial edema.  Attempt to retrieve thoracentesis results.  Empirically started antibiotics were discontinued.  Saturating at 100% on 8 L/min HFNC this morning, discussed with RN to wean down oxygen as tolerated.  Follow chest x-ray in a.m.  Auto diuresing, -4.3 L since admission.  Type II DM with hyperglycemia  Mildly uncontrolled but acceptable with SSI.  Holding home glipizide and Actos.  Essential hypertension  Blood pressures mildly uncontrolled but acceptable.  Continue current dose of Cardizem 30 mg every 6 hourly.  Holding home lisinopril, amlodipine, Lopressor, HCTZ, consider resuming amlodipine and/or Lopressor as needed.  Hyperlipidemia  Consider resuming statins.  Dementia without behavioral abnormalities/depression  Continue Paxil.  Hypokalemia  Replace and follow  Hypomagnesemia/hypophosphatemia  Repleted  Anemia  Relatively stable.  Follow CBC.  DVT prophylaxis: IV heparin infusion Code Status: Full Family Communication: None at bedside Disposition: To be determined pending clinical improvement   Consultants:  PCCM-signed off Nephrology-signed off Cardiology  Procedures:  Foley  catheter-discontinued 11/18  Antimicrobials:  None at this time   Subjective: Patient seen this morning in ICU.  Awake, alert and oriented x3.  Reports feeling weak but otherwise denies complaints.  No chest pain, dyspnea or palpitations.  Objective:  Vitals:   10/01/19 1200 10/01/19 1253 10/01/19 1300 10/01/19 1400  BP:   (!) 162/51 (!) 151/51  Pulse: 88 87 97 86  Resp: 17 (!) 21 20 (!) 21  Temp:      TempSrc:      SpO2: 93% 92% 92% 92%  Weight:      Height:        Examination:  General exam: Elderly female, small built and frail, lying comfortably propped up in bed without distress. Respiratory system: Diminished breath sounds in the bases but otherwise clear to auscultation without wheezing, rhonchi or crackles. Respiratory effort normal. Cardiovascular system: S1 & S2 heard, RRR. No JVD, murmurs, rubs, gallops or clicks. No pedal edema.  Telemetry personally reviewed: Sinus rhythm. Gastrointestinal system: Abdomen is nondistended, soft and nontender. No organomegaly or masses felt. Normal bowel sounds heard. Central nervous system: Alert and oriented x3. No focal neurological deficits. Extremities: Symmetric 5 x 5 power.  Has some bilateral upper extremity edema. Skin: Small area of bruising underneath the right clavicle and right hip. Psychiatry: Judgement and insight appear normal. Mood & affect appropriate.     Data Reviewed: I have personally reviewed following labs and imaging studies   CBC: Recent Labs  Lab 09/28/19 0335 09/28/19 0356 09/29/19 0946 09/30/19 0335 10/01/19 0211  WBC  --  15.3* 15.0* 13.5* 13.4*  NEUTROABS  --  12.9* 12.4* 10.8*  --   HGB 11.9* 12.7 12.7 11.7* 10.7*  HCT 35.0* 39.7 38.1 35.3* 32.7*  MCV  --  97.3 94.5 94.9 94.2  PLT  --  186 197 202 [id number]    Basic Metabolic Panel: Recent Labs  Lab 09/28/19 0356 09/29/19 0351 09/29/19 0946 09/30/19 0335 09/30/19 1609 10/01/19 0211  NA 141 140  --  140 136 139  K 3.4* 3.3*  --  3.1*  3.1* 3.3*  CL 105 100  --  100 98 101  CO2 23 25  --  24 27 27   GLUCOSE 148* 238*  --  213* 191* 154*  BUN 42* 33*  --  22 18 16   CREATININE 4.85* 3.48*  --  2.05* 1.73* 1.50*  CALCIUM 7.8* 8.3*  --  8.1* 7.9* 8.1*  MG 1.7  --  1.6* 1.5*  --  2.1  PHOS 4.1 2.8  --  2.1* 1.6* 2.7    Liver Function Tests: Recent Labs  Lab 09/28/19 0356 09/29/19 0351 09/30/19 0335 10/01/19 0211  AST 19  --   --   --   ALT 16  --   --   --   ALKPHOS 75  --   --   --   BILITOT 0.5  --   --   --   PROT 5.0*  --   --   --   ALBUMIN 2.2* 2.1* 2.1* 2.0*    CBG: Recent Labs  Lab 09/30/19 1555 09/30/19 2313 09/30/19 2338 10/01/19 0714 10/01/19 1153  GLUCAP 184* 135* 148* 166* 179*    Recent Results (from the past 240 hour(s))  MRSA PCR Screening     Status: None   Collection Time: 09/28/19  2:51 AM   Specimen: Nasal Mucosa; Nasopharyngeal  Result Value Ref Range Status   MRSA by PCR  NEGATIVE NEGATIVE Final    Comment:        The GeneXpert MRSA Assay (FDA approved for NASAL specimens only), is one component of a comprehensive MRSA colonization surveillance program. It is not intended to diagnose MRSA infection nor to guide or monitor treatment for MRSA infections. Performed at [facility], [place] [address], [place], [place] [address]   Respiratory Panel by PCR     Status: None   Collection Time: 09/28/19  5:00 AM   Specimen: Nasopharyngeal Swab; Respiratory  Result Value Ref Range Status   Adenovirus NOT DETECTED NOT DETECTED Final   Coronavirus 229E NOT DETECTED NOT DETECTED Final    Comment: (NOTE) The Coronavirus on the Respiratory Panel, DOES NOT test for the novel  Coronavirus (2019 nCoV)    Coronavirus HKU1 NOT DETECTED NOT DETECTED Final   Coronavirus NL63 NOT DETECTED NOT DETECTED Final   Coronavirus OC43 NOT DETECTED NOT DETECTED Final   Metapneumovirus NOT DETECTED NOT DETECTED Final   Rhinovirus / Enterovirus NOT DETECTED NOT DETECTED Final    Influenza A NOT DETECTED NOT DETECTED Final   Influenza B NOT DETECTED NOT DETECTED Final   Parainfluenza Virus 1 NOT DETECTED NOT DETECTED Final   Parainfluenza Virus 2 NOT DETECTED NOT DETECTED Final   Parainfluenza Virus 3 NOT DETECTED NOT DETECTED Final   Parainfluenza Virus 4 NOT DETECTED NOT DETECTED Final   Respiratory Syncytial Virus NOT DETECTED NOT DETECTED Final   Bordetella pertussis NOT DETECTED NOT DETECTED Final   Chlamydophila pneumoniae NOT DETECTED NOT DETECTED Final   Mycoplasma pneumoniae NOT DETECTED NOT DETECTED Final    Comment: Performed at [facility], [place]. [address], [place], [place] [address]      Radiology Studies: Dg Chest Port 1 View  Result Date: 09/30/2019 CLINICAL DATA:  Dyspnea, weakness EXAM: PORTABLE CHEST 1 VIEW COMPARISON:  09/28/2019 FINDINGS: Multifocal interstitial/patchy opacities in the lungs bilaterally. Moderate left pleural effusion, grossly unchanged. Associated left lower lobe atelectasis. No pneumothorax. Cardiomegaly.  Thoracic aortic atherosclerosis. IMPRESSION: Multifocal interstitial/patchy opacities in the lungs bilaterally. Given associated moderate left pleural effusion and cardiomegaly, this appearance likely reflects moderate interstitial edema. However, in the appropriate clinical setting, multifocal infection/pneumonia is not excluded. Electronically Signed   By: Julian Hy M.D.   On: 09/30/2019 10:05          Scheduled Meds: . amiodarone  400 mg Oral BID  . Chlorhexidine Gluconate Cloth  6 each Topical Daily  . diltiazem  30 mg Oral Q6H  . insulin aspart  0-5 Units Subcutaneous QHS  . insulin aspart  0-9 Units Subcutaneous TID WC  . magnesium oxide  400 mg Oral BID  . PARoxetine  20 mg Oral Daily  . potassium chloride  20 mEq Oral BID   Continuous Infusions: . heparin 900 Units/hr (10/01/19 1400)     LOS: 3 days     Vernell Leep, MD, [place], [facility]. [facility]  To contact the attending  provider between 7A-7P or the covering provider during after hours 7P-7A, please log into the web site [email or web address] and access using universal [place] password for that web site. If you do not have the password, please call the hospital operator.  10/01/2019, 3:01 PM

## 2019-10-02 ENCOUNTER — Inpatient Hospital Stay (HOSPITAL_COMMUNITY): Payer: Medicare Other

## 2019-10-02 DIAGNOSIS — Z7901 Long term (current) use of anticoagulants: Secondary | ICD-10-CM

## 2019-10-02 LAB — GLUCOSE, CAPILLARY
Glucose-Capillary: 160 mg/dL — ABNORMAL HIGH (ref 70–99)
Glucose-Capillary: 186 mg/dL — ABNORMAL HIGH (ref 70–99)
Glucose-Capillary: 165 mg/dL — ABNORMAL HIGH (ref 70–99)
Glucose-Capillary: 141 mg/dL — ABNORMAL HIGH (ref 70–99)
Glucose-Capillary: 153 mg/dL — ABNORMAL HIGH (ref 70–99)

## 2019-10-02 LAB — BASIC METABOLIC PANEL
Anion gap: 9 (ref 5–15)
BUN: 18 mg/dL (ref 8–23)
CO2: 27 mmol/L (ref 22–32)
Calcium: 7.9 mg/dL — ABNORMAL LOW (ref 8.9–10.3)
Chloride: 102 mmol/L (ref 98–111)
Creatinine, Ser: 1.47 mg/dL — ABNORMAL HIGH (ref 0.44–1.00)
GFR calc Af Amer: 40 mL/min — ABNORMAL LOW (ref >60)
GFR calc non Af Amer: 35 mL/min — ABNORMAL LOW (ref >60)
Glucose, Bld: 174 mg/dL — ABNORMAL HIGH (ref 70–99)
Potassium: 3.5 mmol/L (ref 3.5–5.1)
Sodium: 138 mmol/L (ref 135–145)

## 2019-10-02 LAB — CBC
HCT: 33 % — ABNORMAL LOW (ref 36.0–46.0)
Hemoglobin: 10.6 g/dL — ABNORMAL LOW (ref 12.0–15.0)
MCH: 31.1 pg (ref 26.0–34.0)
MCHC: 32.1 g/dL (ref 30.0–36.0)
MCV: 96.8 fL (ref 80.0–100.0)
Platelets: 239 10*3/uL (ref 150–400)
RBC: 3.41 MIL/uL — ABNORMAL LOW (ref 3.87–5.11)
RDW: 14.8 % (ref 11.5–15.5)
WBC: 13.8 10*3/uL — ABNORMAL HIGH (ref 4.0–10.5)
nRBC: 0 % (ref 0.0–0.2)

## 2019-10-02 LAB — HEPARIN LEVEL (UNFRACTIONATED)
Heparin Unfractionated: 0.12 IU/mL — ABNORMAL LOW (ref 0.30–0.70)
Heparin Unfractionated: 0.27 IU/mL — ABNORMAL LOW (ref 0.30–0.70)

## 2019-10-02 MED ORDER — METOPROLOL TARTRATE 5 MG/5ML IV SOLN
5.0000 mg | Freq: Four times a day (QID) | INTRAVENOUS | Status: DC | PRN
  Filled 2019-10-02: qty 5

## 2019-10-02 MED ORDER — APIXABAN 5 MG PO TABS
5.0000 mg | ORAL_TABLET | Freq: Two times a day (BID) | ORAL | Status: DC
  Administered 2019-10-02 – 2019-10-06 (×8): 5 mg via ORAL
  Filled 2019-10-02 (×8): qty 1

## 2019-10-02 NOTE — Progress Notes (Signed)
Transitions of Care Pharmacist Note  Annalisha Bezdek is a 73 y.o. female that has been diagnosed with A Fib and will be prescribed Eliquis (apixaban) at discharge.   Patient Education: I provided the following education on 11/19 to the patient: How to take the medication Described what the medication is Signs of bleeding Signs/symptoms of VTE and stroke  Answered their questions  Discharge Medications Plan: The patient wants to have their discharge medications filled by the Transitions of Care pharmacy rather than their usual pharmacy.  The discharge orders pharmacy has been changed to the Transitions of Care pharmacy, the patient will receive a phone call regarding co-pay, and their medications will be delivered by the Transitions of Care pharmacy.    Thank you,   Kennon Holter, PharmD PGY1 Ambulatory Care Pharmacy Resident [employer] Phone: [phone] October 02, 2019

## 2019-10-02 NOTE — Progress Notes (Signed)
[place] for Heparin Indication: atrial fibrillation  Allergies  Allergen Reactions  . Metformin And Related Diarrhea    Patient Measurements: Height: 5' (152.4 cm) Weight: 148 lb 9.4 oz (67.4 kg) IBW/kg (Calculated) : 45.5 Heparin Dosing Weight: 60.5 kg  Vital Signs: Temp: 98.4 F (36.9 C) (11/19 0322) Temp Source: Oral (11/19 0322) BP: 147/61 (11/19 0300) Pulse Rate: 83 (11/19 0300)  Labs: Recent Labs    09/30/19 0335 09/30/19 1609 09/30/19 1848 10/01/19 0211 10/02/19 0227  HGB 11.7*  --   --  10.7* 10.6*  HCT 35.3*  --   --  32.7* 33.0*  PLT 202  --   --  210 239  HEPARINUNFRC  --   --  0.48 0.32 0.12*  CREATININE 2.05* 1.73*  --  1.50* 1.47*    Estimated Creatinine Clearance: 28.8 mL/min (A) (by C-G formula based on SCr of 1.47 mg/dL (H)).  Assessment: 73 y.o. female with Afib for heparin  Goal of Therapy:  Heparin level 0.3-0.7 units/ml Monitor platelets by anticoagulation protocol: Yes   Plan:  Increase Heparin  1050 units/hr Check heparin level in 8 hours.   Phillis Knack, PharmD, BCPS

## 2019-10-02 NOTE — Progress Notes (Addendum)
ANTICOAGULATION CONSULT NOTE  Pharmacy Consult for Heparin Indication: atrial fibrillation   Patient Measurements: Height: 5' (152.4 cm) Weight: 150 lb 2.1 oz (68.1 kg) IBW/kg (Calculated) : 45.5 Heparin Dosing Weight: 60.5 kg  Vital Signs: Temp: 98.7 F (37.1 C) (11/19 1123) Temp Source: Oral (11/19 1123) BP: 144/74 (11/19 0900) Pulse Rate: 86 (11/19 0900)  Labs: Recent Labs    09/30/19 0335 09/30/19 1609  10/01/19 0211 10/02/19 0227 10/02/19 1103  HGB 11.7*  --   --  10.7* 10.6*  --   HCT 35.3*  --   --  32.7* 33.0*  --   PLT 202  --   --  210 239  --   HEPARINUNFRC  --   --    < > 0.32 0.12* 0.27*  CREATININE 2.05* 1.73*  --  1.50* 1.47*  --    < > = values in this interval not displayed.    Estimated Creatinine Clearance: 28.9 mL/min (A) (by C-G formula based on SCr of 1.47 mg/dL (H)).  Assessment: 73 y.o. female with Afib requiring anticoagulation for stroke prevention. HL slightly subtherapeutic at 0.27 with previous increase to 1050 units/hr. Hg and PLT wnl.  Goal of Therapy:  Heparin level 0.3-0.7 units/ml Monitor platelets by anticoagulation protocol: Yes   Plan:  Increase Heparin to 1150 units/hr. Check heparin level in 8 hours. CBC with AM labs. Monitor for signs and symptoms of bleeding, Hg, PLT.  Onnie Boer; PharmD Candidate    I agree with the assessment and plan.  Jezlyn Westerfield, Jake Church  10/02/2019 12:19 PM    Addendum -Will transition patient from heparin to Eliquis  -Stop heparin at the same time Eliquis is given -Eliquis 5 mg po bid   Harvel Quale 10/02/2019 1:40 PM

## 2019-10-02 NOTE — Discharge Instructions (Signed)

## 2019-10-02 NOTE — Evaluation (Signed)
Physical Therapy Evaluation Patient Details Name: Summer Goodman MRN: [id number] DOB: Dec 06, 1945 Today's Date: 10/02/2019   History of Present Illness  73 year old female with recent diagnosis of UTI who was started on Bactrim and subsequently developed acute kidney injury and admitted to [facility].  She was resuscitated with fluid with no improvement in her renal function however she developed volume overload with resulting hypoxic respiratory failure and large left-sided pleural effusion.  Underwent thoracentesis of 650 mL fluid and transferred to [facility] ICU 09/28/19. 11/17 episodes of SVT and Afib.  Clinical Impression  Pt's husband in hallway reporting concern for patient as she is becoming confused, seeing people who are not here, as well as multiple falls, reporting heavy use of furniture for support with ambulation PTA pt lives with husband in double wide mobile home with 5 steps to enter. Pt reports independence with mobility and ADLs, husband assists with iADLs. Pt currently requires min A for sit>stand and minA progressing to modA for stepping 2 feet forward and back to chair. PT recommends SNF level rehab at discharge to improve strength and balance. PT will continue to follow acutely.     Follow Up Recommendations SNF    Equipment Recommendations  Other (comment)(TBD at next venue)    Recommendations for Other Services OT consult     Precautions / Restrictions Precautions Precautions: Fall Precaution Comments: hx of falling  Restrictions Weight Bearing Restrictions: No      Mobility  Bed Mobility               General bed mobility comments: OOB in recliner on entry  Transfers Overall transfer level: Needs assistance Equipment used: Rolling walker (2 wheeled) Transfers: Sit to/from Stand Sit to Stand: Min assist         General transfer comment: minA for power up and steadying in RW, vc for hand placement for power up, pt with increased posterior lean  requiring assist to bring CoG over BoS  Ambulation/Gait Ambulation/Gait assistance: Mod assist;Min assist Gait Distance (Feet): 2 Feet Assistive device: Rolling walker (2 wheeled) Gait Pattern/deviations: Step-to pattern;Decreased step length - right;Decreased step length - left;Shuffle;Narrow base of support Gait velocity: slowed Gait velocity interpretation: <1.31 ft/sec, indicative of household ambulator General Gait Details: minA for steadying for stepping forward with decreasing stability and increased knee buckling, modA for stepping back to chair        Balance Overall balance assessment: Needs assistance Sitting-balance support: Feet supported;Bilateral upper extremity supported;No upper extremity supported;Single extremity supported;Feet unsupported Sitting balance-Leahy Scale: Fair     Standing balance support: Bilateral upper extremity supported Standing balance-Leahy Scale: Poor Standing balance comment: requires B UE support on RW, increased posterior lean                             Pertinent Vitals/Pain Pain Assessment: No/denies pain    Home Living Family/patient expects to be discharged to:: Private residence Living Arrangements: Spouse/significant other Available Help at Discharge: Family;Available 24 hours/day Type of Home: Mobile home Home Access: Stairs to enter Entrance Stairs-Rails: Can reach both Entrance Stairs-Number of Steps: 5 Home Layout: One level Home Equipment: Shower seat;Cane - single point;Walker - 2 wheels;Wheelchair - manual      Prior Function Level of Independence: Needs assistance   Gait / Transfers Assistance Needed: husband reports pt with heavy use of furniture to support herself and hx of falling, pt reports one fall  ADL's / Homemaking Assistance Needed: independent in  ADLs, husband assists with iADLs           Extremity/Trunk Assessment   Upper Extremity Assessment Upper Extremity Assessment: Overall WFL  for tasks assessed    Lower Extremity Assessment Lower Extremity Assessment: Overall WFL for tasks assessed    Cervical / Trunk Assessment Cervical / Trunk Assessment: Kyphotic  Communication   Communication: No difficulties  Cognition Arousal/Alertness: Awake/alert Behavior During Therapy: WFL for tasks assessed/performed Overall Cognitive Status: History of cognitive impairments - at baseline                                 General Comments: pt husband spoke with therapist prior to entering hospital room and reports pt is becoming more confused lately, and reports seeing people who are not there, he is very concerned      General Comments General comments (skin integrity, edema, etc.): pt with one run of SVT during session max HR 155 bpm, otherwise HR 76-95bpm with mobility, SaO2 on 2L O2 via [place] 92-96%O2         Assessment/Plan    PT Assessment Patient needs continued PT services  PT Problem List Decreased strength;Decreased activity tolerance;Decreased balance;Decreased mobility;Cardiopulmonary status limiting activity;Decreased knowledge of use of DME;Decreased safety awareness       PT Treatment Interventions DME instruction;Gait training;Functional mobility training;Therapeutic activities;Therapeutic exercise;Balance training;Cognitive remediation;Patient/family education    PT Goals (Current goals can be found in the Care Plan section)  Acute Rehab PT Goals Patient Stated Goal: see her dog Millie PT Goal Formulation: With patient/family Time For Goal Achievement: 10/16/19 Potential to Achieve Goals: Good    Frequency Min 2X/week    AM-PAC PT "6 Clicks" Mobility  Outcome Measure Help needed turning from your back to your side while in a flat bed without using bedrails?: None Help needed moving from lying on your back to sitting on the side of a flat bed without using bedrails?: A Lot Help needed moving to and from a bed to a chair (including a  wheelchair)?: A Lot Help needed standing up from a chair using your arms (e.g., wheelchair or bedside chair)?: A Little Help needed to walk in hospital room?: A Lot Help needed climbing 3-5 steps with a railing? : Total 6 Click Score: 14    End of Session Equipment Utilized During Treatment: Gait belt;Oxygen Activity Tolerance: Patient tolerated treatment well Patient left: in chair;with call bell/phone within reach;with chair alarm set;with nursing/sitter in room;with family/visitor present Nurse Communication: Mobility status PT Visit Diagnosis: Unsteadiness on feet (R26.81);Other abnormalities of gait and mobility (R26.89);Repeated falls (R29.6);Muscle weakness (generalized) (M62.81);History of falling (Z91.81);Difficulty in walking, not elsewhere classified (R26.2)    Time: [id number] PT Time Calculation (min) (ACUTE ONLY): 25 min   Charges:   PT Evaluation $PT Eval Moderate Complexity: 1 Mod PT Treatments $Therapeutic Activity: 8-22 mins        Camber Ninh B. Migdalia Dk PT, DPT Acute Rehabilitation Services Pager [phone] Office [phone]   [place] 10/02/2019, 11:53 AM

## 2019-10-02 NOTE — Progress Notes (Signed)
Patient went into A Fib RVR with rate in 180's. MD aware, orders for PRN Lopressor.

## 2019-10-02 NOTE — Progress Notes (Signed)
PROGRESS NOTE   Summer Goodman  [id number]    DOB: [birth year]    DOA: 09/28/2019  PCP: Patient, No Pcp Per   I have briefly reviewed patients previous medical records in [facility].  Chief Complaint: Acute renal failure  Brief Narrative:  73 year old female with PMH of dementia, HTN, HLD, type II DM, osteoporosis, diagnosed with UTI on 11/9 and started on Bactrim, subsequently developed malaise, weakness, decreased oral intake and presented to [facility] on 11/12 with acute kidney injury, creatinine ~4 with baseline <1, admitted at [facility], started on IV Zosyn, developed hypoxia and left pleural effusion, underwent thoracentesis 650 mL out, VQ scan with low probability for PE, TTE with LVEF 60-65%, noncontrast CT chest and CT abdomen pelvis negative for obstructive uropathy, due to worsening oxygen demand (10 L by [place]) and worsening renal failure, patient was transferred to [facility] on 11/15 for nephrology and critical care consult.  Stabilized and transferred to [facility] on 11/16.  Nephrology consulted and signed off.  Course complicated by A. fib with RVR, cardiology consulted. Reverted to sinus rhythm. Overall improved, downgraded to cardiac telemetry 11/18.   Assessment & Plan:   Active Problems:   AKI (acute kidney injury) ([place])   Acute respiratory failure with hypoxia ([place])   Acute cystitis without hematuria   Acute kidney injury  Nephrology consulted and assisted with evaluation and management.  Nephrology signed off 11/16.  Acute kidney injury felt to be due to ACEI/dehydration/Bactrim causing ATN most likely.  UA without significant proteinuria or hematuria to suggest glomerular disease.  Creatinine had peaked to 4 at OSH.  Received a dose of IV Lasix on night of admission here.  Creatinine peaked to 4.8.  Creatinine has improved but plateaued in the 1.4 7-1 0.5 range over the last 2 days. Her last known serum creatinine was normal at 0.81 on  9/30.  Follow daily BMP.  Volume overload  As suggested initially by left-sided pleural effusion and lower extremity edema.  TTE at outside facility showed normal LVEF.  Received IV Lasix early on in admission.  Does have some upper extremity edema but continues to improve.  Continue auto diuresis without Lasix given recent acute kidney injury.  Recent UTI  UA here appears negative.  Paroxysmal A. fib with RVR  Cardiology assisting with management.  Amiodarone and Cardizem drip have been transitioned to oral diltiazem 30 mg every 6 hourly and amiodarone 400 mg twice daily.  She has been in sinus rhythm since early morning of 11/17, continue telemetry.  As per cardiology, continue IV heparin for now and will likely need DOAC once creatinine stabilizes.  Acute respiratory failure with hypoxia/left pleural effusion  PCCM consulted early on in admission.  Chest x-ray 11/17: Multifocal interstitial/patchy opacities in the lungs bilaterally.  Associated moderate left pleural effusion and cardiomegaly, reflects interstitial edema.  Attempt to retrieve thoracentesis results.  Empirically started antibiotics were discontinued.  Now down to 2 L/min HFNC and saturating in the mid 90s. Incentive spirometry  Chest x-ray 11/19 personally reviewed, pulmonary edema has improved, left pleural effusion appears smaller.  Auto diuresing, -3.9 L since admission  Type II DM with hyperglycemia  Mildly uncontrolled but acceptable with SSI.  Holding home glipizide and Actos.  Essential hypertension  Blood pressures mildly uncontrolled but acceptable.  Continue current dose of Cardizem 30 mg every 6 hourly.  Holding home lisinopril, amlodipine, Lopressor, HCTZ, consider resuming amlodipine and/or Lopressor as needed.  Hyperlipidemia  Consider resuming statins.  Dementia without behavioral abnormalities/depression  Continue Paxil.  Hypokalemia  Replaced   Hypomagnesemia/hypophosphatemia  Repleted  Anemia  Relatively stable.  Follow CBC.  Right clavicular fracture  Noted on imaging at [facility], sustained status post fall. No pain reported. Conservative management.  DVT prophylaxis: IV heparin infusion Code Status: Full Family Communication: None at bedside Disposition: PT recommends SNF. Hopefully will be medically ready for discharge in the next 1 to 2 days.   Consultants:  PCCM-signed off Nephrology-signed off Cardiology  Procedures:  Foley catheter-discontinued 11/18  Antimicrobials:  None at this time   Subjective: Patient is alert and oriented x3. Appetite improving, seen that she ate 50% of her breakfast. Denies dyspnea. Mild intermittent dry cough. No chest pain. Reports that she lives with her spouse, independent prior to admission, former smoker but had quit.  Objective:  Vitals:   10/02/19 1300 10/02/19 1400 10/02/19 1500 10/02/19 1513  BP: (!) 182/86 (!) 164/56 (!) 156/74   Pulse: 85 82 80   Resp: 20 20 18    Temp:    98.1 F (36.7 C)  TempSrc:    Oral  SpO2: 96% 95% 95%   Weight:      Height:        Examination:  General exam: Elderly female, small built and frail, sitting up comfortably in bed eating breakfast this morning. Respiratory system: Reduced breath sounds in the bases, left greater than right but otherwise clear to auscultation. No increased work of breathing. Cardiovascular system: S1 & S2 heard, RRR. No JVD, murmurs, rubs, gallops or clicks. No pedal edema. Has trace bilateral upper extremity edema which is better than yesterday. Telemetry personally reviewed: Sinus rhythm. Gastrointestinal system: Abdomen is nondistended, soft and nontender. No organomegaly or masses felt. Normal bowel sounds heard. Central nervous system: Alert and oriented x3. No focal neurological deficits. Extremities: Symmetric 5 x 5 power.  Has some bilateral upper extremity edema, improving. Skin: Small  area of bruising underneath the right clavicle and right hip. Psychiatry: Judgement and insight appear normal. Mood & affect appropriate.     Data Reviewed: I have personally reviewed following labs and imaging studies   CBC: Recent Labs  Lab 09/28/19 0356 09/29/19 0946 09/30/19 0335 10/01/19 0211 10/02/19 0227  WBC 15.3* 15.0* 13.5* 13.4* 13.8*  NEUTROABS 12.9* 12.4* 10.8*  --   --   HGB 12.7 12.7 11.7* 10.7* 10.6*  HCT 39.7 38.1 35.3* 32.7* 33.0*  MCV 97.3 94.5 94.9 94.2 96.8  PLT 186 197 202 210 [id number]    Basic Metabolic Panel: Recent Labs  Lab 09/28/19 0356 09/29/19 0351 09/29/19 0946 09/30/19 0335 09/30/19 1609 10/01/19 0211 10/02/19 0227  NA 141 140  --  140 136 139 138  K 3.4* 3.3*  --  3.1* 3.1* 3.3* 3.5  CL 105 100  --  100 98 101 102  CO2 23 25  --  24 27 27 27   GLUCOSE 148* 238*  --  213* 191* 154* 174*  BUN 42* 33*  --  22 18 16 18   CREATININE 4.85* 3.48*  --  2.05* 1.73* 1.50* 1.47*  CALCIUM 7.8* 8.3*  --  8.1* 7.9* 8.1* 7.9*  MG 1.7  --  1.6* 1.5*  --  2.1  --   PHOS 4.1 2.8  --  2.1* 1.6* 2.7  --     Liver Function Tests: Recent Labs  Lab 09/28/19 0356 09/29/19 0351 09/30/19 0335 10/01/19 0211  AST 19  --   --   --  ALT 16  --   --   --   ALKPHOS 75  --   --   --   BILITOT 0.5  --   --   --   PROT 5.0*  --   --   --   ALBUMIN 2.2* 2.1* 2.1* 2.0*    CBG: Recent Labs  Lab 10/01/19 2234 10/01/19 2308 10/02/19 0733 10/02/19 1123 10/02/19 1512  GLUCAP 177* 160* 186* 165* 141*    Recent Results (from the past 240 hour(s))  MRSA PCR Screening     Status: None   Collection Time: 09/28/19  2:51 AM   Specimen: Nasal Mucosa; Nasopharyngeal  Result Value Ref Range Status   MRSA by PCR NEGATIVE NEGATIVE Final    Comment:        The GeneXpert MRSA Assay (FDA approved for NASAL specimens only), is one component of a comprehensive MRSA colonization surveillance program. It is not intended to diagnose MRSA infection nor to guide or  monitor treatment for MRSA infections. Performed at [facility], [place] [address], [place], [place] [address]   Respiratory Panel by PCR     Status: None   Collection Time: 09/28/19  5:00 AM   Specimen: Nasopharyngeal Swab; Respiratory  Result Value Ref Range Status   Adenovirus NOT DETECTED NOT DETECTED Final   Coronavirus 229E NOT DETECTED NOT DETECTED Final    Comment: (NOTE) The Coronavirus on the Respiratory Panel, DOES NOT test for the novel  Coronavirus (2019 nCoV)    Coronavirus HKU1 NOT DETECTED NOT DETECTED Final   Coronavirus NL63 NOT DETECTED NOT DETECTED Final   Coronavirus OC43 NOT DETECTED NOT DETECTED Final   Metapneumovirus NOT DETECTED NOT DETECTED Final   Rhinovirus / Enterovirus NOT DETECTED NOT DETECTED Final   Influenza A NOT DETECTED NOT DETECTED Final   Influenza B NOT DETECTED NOT DETECTED Final   Parainfluenza Virus 1 NOT DETECTED NOT DETECTED Final   Parainfluenza Virus 2 NOT DETECTED NOT DETECTED Final   Parainfluenza Virus 3 NOT DETECTED NOT DETECTED Final   Parainfluenza Virus 4 NOT DETECTED NOT DETECTED Final   Respiratory Syncytial Virus NOT DETECTED NOT DETECTED Final   Bordetella pertussis NOT DETECTED NOT DETECTED Final   Chlamydophila pneumoniae NOT DETECTED NOT DETECTED Final   Mycoplasma pneumoniae NOT DETECTED NOT DETECTED Final    Comment: Performed at [facility], [place]. [address], [place], [place] [address]      Radiology Studies: Dg Chest Port 1 View  Result Date: 10/02/2019 CLINICAL DATA:  Pleural effusion. EXAM: PORTABLE CHEST 1 VIEW COMPARISON:  Radiograph 09/30/2019. CT 09/26/2019 FINDINGS: Moderate left pleural effusion slightly improved from prior exam. Small right pleural effusion is unchanged. Improving of patchy bilateral opacities with residual bronchial and interstitial thickening, likely improving edema. Cardiomegaly with unchanged mediastinal contours. No pneumothorax. IMPRESSION: 1. Moderate left  pleural effusion slightly decreased. Small right pleural effusion is unchanged. 2. Improving patchy bilateral opacities, likely improving pulmonary edema. 3. Cardiomegaly is unchanged. Electronically Signed   By: Keith Rake M.D.   On: 10/02/2019 04:25          Scheduled Meds: . amiodarone  400 mg Oral BID  . apixaban  5 mg Oral BID  . Chlorhexidine Gluconate Cloth  6 each Topical Daily  . diltiazem  30 mg Oral Q6H  . insulin aspart  0-5 Units Subcutaneous QHS  . insulin aspart  0-9 Units Subcutaneous TID WC  . magnesium oxide  400 mg Oral BID  .  PARoxetine  20 mg Oral Daily  . potassium chloride  20 mEq Oral BID   Continuous Infusions: . heparin 1,150 Units/hr (10/02/19 1322)     LOS: 4 days     Vernell Leep, MD, [place], [facility]. [facility]  To contact the attending provider between 7A-7P or the covering provider during after hours 7P-7A, please log into the web site [email or web address] and access using universal [place] password for that web site. If you do not have the password, please call the hospital operator.  10/02/2019, 3:38 PM

## 2019-10-03 ENCOUNTER — Other Ambulatory Visit: Payer: Self-pay

## 2019-10-03 ENCOUNTER — Encounter (HOSPITAL_COMMUNITY): Payer: Self-pay

## 2019-10-03 DIAGNOSIS — J90 Pleural effusion, not elsewhere classified: Secondary | ICD-10-CM

## 2019-10-03 DIAGNOSIS — R0603 Acute respiratory distress: Secondary | ICD-10-CM

## 2019-10-03 LAB — BASIC METABOLIC PANEL
Anion gap: 14 (ref 5–15)
BUN: 18 mg/dL (ref 8–23)
CO2: 25 mmol/L (ref 22–32)
Calcium: 8.3 mg/dL — ABNORMAL LOW (ref 8.9–10.3)
Chloride: 102 mmol/L (ref 98–111)
Creatinine, Ser: 1.29 mg/dL — ABNORMAL HIGH (ref 0.44–1.00)
GFR calc Af Amer: 47 mL/min — ABNORMAL LOW (ref >60)
GFR calc non Af Amer: 41 mL/min — ABNORMAL LOW (ref >60)
Glucose, Bld: 173 mg/dL — ABNORMAL HIGH (ref 70–99)
Potassium: 3.7 mmol/L (ref 3.5–5.1)
Sodium: 141 mmol/L (ref 135–145)

## 2019-10-03 LAB — GLUCOSE, CAPILLARY
Glucose-Capillary: 179 mg/dL — ABNORMAL HIGH (ref 70–99)
Glucose-Capillary: 208 mg/dL — ABNORMAL HIGH (ref 70–99)
Glucose-Capillary: 221 mg/dL — ABNORMAL HIGH (ref 70–99)
Glucose-Capillary: 149 mg/dL — ABNORMAL HIGH (ref 70–99)

## 2019-10-03 LAB — CBC
HCT: 31.5 % — ABNORMAL LOW (ref 36.0–46.0)
Hemoglobin: 10 g/dL — ABNORMAL LOW (ref 12.0–15.0)
MCH: 31.3 pg (ref 26.0–34.0)
MCHC: 31.7 g/dL (ref 30.0–36.0)
MCV: 98.4 fL (ref 80.0–100.0)
Platelets: 249 10*3/uL (ref 150–400)
RBC: 3.2 MIL/uL — ABNORMAL LOW (ref 3.87–5.11)
RDW: 14.5 % (ref 11.5–15.5)
WBC: 13.4 10*3/uL — ABNORMAL HIGH (ref 4.0–10.5)
nRBC: 0.1 % (ref 0.0–0.2)

## 2019-10-03 NOTE — TOC Initial Note (Signed)
Transition of Care [facility]) - Initial/Assessment Note    Patient Details  Name: Summer Goodman MRN: [id number] Date of Birth: 1946-08-18  Transition of Care [facility]) CM/SW Contact:    Archie Endo, LCSW Phone Number: 10/03/2019, 1:27 PM  Clinical Narrative:                 CSW met with patient and her husband Francee Piccolo at bedside. Patient and her husband are both agreeable to SNF placement at discharge. Roger requests [facility] be placed at [facility] as they are familiar with that facility. CSW will fax patient's information to [facility] and other facilities in [facility] for SNF placement.  Expected Discharge Plan: Skilled Nursing Facility Barriers to Discharge: SNF Pending bed offer, Continued Medical Work up   Patient Goals and CMS Choice        Expected Discharge Plan and Services Expected Discharge Plan: [place]                                              Prior Living Arrangements/Services     Patient language and need for interpreter reviewed:: No Do you feel safe going back to the place where you live?: Yes      Need for Family Participation in Patient Care: Yes (Comment) Care giver support system in place?: Yes (comment)   Criminal Activity/Legal Involvement Pertinent to Current Situation/Hospitalization: No - Comment as needed  Activities of Daily Living      Permission Sought/Granted                  Emotional Assessment Appearance:: Appears stated age Attitude/Demeanor/Rapport: Engaged, Gracious Affect (typically observed): Hopeful, Happy, Calm Orientation: : Oriented to Self, Oriented to Place, Fluctuating Orientation (Suspected and/or reported Sundowners) Alcohol / Substance Use: Never Used Psych Involvement: No (comment)  Admission diagnosis:  RESPIRATORY FAILURE RENAL FAILURE HYPOXIA Patient Active Problem List   Diagnosis Date Noted  . Acute respiratory distress   . Recurrent pleural effusion on left   . AKI  (acute kidney injury) ([place]) 09/28/2019  . Acute respiratory failure with hypoxia ([place])   . Acute cystitis without hematuria   . Diabetes mellitus ([place]) 04/18/2016  . Restless legs syndrome 02/23/2016  . Essential (primary) hypertension 07/11/2012  . Gastro-esophageal reflux disease without esophagitis 07/11/2012  . Anxiety 07/11/2012  . Hyperlipidemia, unspecified 07/11/2012  . Osteoporosis 07/11/2012   PCP:  Patient, No Pcp Per Pharmacy:   [place], [place] [address] Phone: [phone] Fax: [place], [place] [address] [place] [address] Phone: [phone] Fax: [phone]     Social Determinants of Health (SDOH) Interventions    Readmission Risk Interventions No flowsheet data found.

## 2019-10-03 NOTE — NC FL2 (Signed)
[place] LEVEL OF CARE SCREENING TOOL     IDENTIFICATION  Patient Name: Summer Goodman Birthdate: 02-11-1946 Sex: female Admission Date (Current Location): 09/28/2019  [facility] and [place] Number:  Herbalist and Address:  [facility] [place], [place] [address] [place], [place] [address]      Provider Number: [id number]  Attending Physician Name and Address:  Lorella Nimrod, MD  Relative Name and Phone Number:  Francee Piccolo [phone]    Current Level of Care: Hospital Recommended Level of Care: [place] Prior Approval Number:    Date Approved/Denied: 10/03/19 PASRR Number: [id number]  Discharge Plan: SNF    Current Diagnoses: Patient Active Problem List   Diagnosis Date Noted  . Acute respiratory distress   . Recurrent pleural effusion on left   . AKI (acute kidney injury) ([place]) 09/28/2019  . Acute respiratory failure with hypoxia ([place])   . Acute cystitis without hematuria   . Diabetes mellitus ([place]) 04/18/2016  . Restless legs syndrome 02/23/2016  . Essential (primary) hypertension 07/11/2012  . Gastro-esophageal reflux disease without esophagitis 07/11/2012  . Anxiety 07/11/2012  . Hyperlipidemia, unspecified 07/11/2012  . Osteoporosis 07/11/2012    Orientation RESPIRATION BLADDER Height & Weight     Self, Time, Place  Normal Continent Weight: 148 lb 2.4 oz (67.2 kg) Height:  5' (152.4 cm)  BEHAVIORAL SYMPTOMS/MOOD NEUROLOGICAL BOWEL NUTRITION STATUS      Continent    AMBULATORY STATUS COMMUNICATION OF NEEDS Skin   Limited Assist Verbally Normal                       Personal Care Assistance Level of Assistance  Bathing, Dressing, Feeding Bathing Assistance: Limited assistance Feeding assistance: Independent Dressing Assistance: Limited assistance Total Care Assistance: Maximum assistance   Functional Limitations Info  Speech, Hearing, Sight Sight Info: Adequate Hearing Info: Adequate Speech  Info: Adequate    SPECIAL CARE FACTORS FREQUENCY  OT (By licensed OT), PT (By licensed PT)     PT Frequency: 5x weekly OT Frequency: 5x weekly            Contractures Contractures Info: Not present    Additional Factors Info  Allergies, Code Status Code Status Info: Full Code Allergies Info: Metformin           Current Medications (10/03/2019):  This is the current hospital active medication list Current Facility-Administered Medications  Medication Dose Route Frequency Provider Last Rate Last Dose  . amiodarone (PACERONE) tablet 400 mg  400 mg Oral BID Jettie Booze, MD   400 mg at 10/03/19 0920  . apixaban (ELIQUIS) tablet 5 mg  5 mg Oral BID Harvel Quale, RPH   5 mg at 10/03/19 0920  . Chlorhexidine Gluconate Cloth 2 % PADS 6 each  6 each Topical Daily Juanito Doom, MD   6 each at 10/02/19 0901  . clonazePAM (KLONOPIN) tablet 2 mg  2 mg Oral Daily PRN Simonne Maffucci B, MD      . diltiazem (CARDIZEM) tablet 30 mg  30 mg Oral Q6H Jettie Booze, MD   30 mg at 10/03/19 0920  . insulin aspart (novoLOG) injection 0-5 Units  0-5 Units Subcutaneous QHS McQuaid, Douglas B, MD      . insulin aspart (novoLOG) injection 0-9 Units  0-9 Units Subcutaneous TID WC Juanito Doom, MD   3 Units at 10/03/19 1141  . magnesium oxide (MAG-OX) tablet 400 mg  400 mg Oral  BID Cherylann Ratel A, DO   400 mg at 10/03/19 0920  . metoprolol tartrate (LOPRESSOR) injection 5 mg  5 mg Intravenous Q6H PRN Kirby-Graham, Karsten Fells, NP      . PARoxetine (PAXIL) tablet 20 mg  20 mg Oral Daily Simonne Maffucci B, MD   20 mg at 10/03/19 [id number]  . potassium chloride SA (KLOR-CON) CR tablet 20 mEq  20 mEq Oral BID Jettie Booze, MD   20 mEq at 10/03/19 [id number]     Discharge Medications: Please see discharge summary for a list of discharge medications.  Relevant Imaging Results:  Relevant Lab Results:   Additional Information SSN: [id number]  Archie Endo, LCSW

## 2019-10-03 NOTE — Progress Notes (Signed)
PROGRESS NOTE    Summer Goodman  [id number] DOB: 1945-12-21 DOA: 09/28/2019 PCP: Patient, No Pcp Per   Brief Narrative:  73 year old female with PMH of dementia, HTN, HLD, type II DM, osteoporosis, diagnosed with UTI on 11/9 and started on Bactrim, subsequently developed malaise, weakness, decreased oral intake and presented to [facility] on 11/12 with acute kidney injury, creatinine ~4 with baseline <1, admitted at [facility], started on IV Zosyn, developed hypoxia and left pleural effusion, underwent thoracentesis 650 mL out, VQ scan with low probability for PE, TTE with LVEF 60-65%, noncontrast CT chest and CT abdomen pelvis negative for obstructive uropathy, due to worsening oxygen demand (10 L by [place]) and worsening renal failure, patient was transferred to [facility] on 11/15 for nephrology and critical care consult.  Stabilized and transferred to [facility] on 11/16.  Nephrology consulted and signed off.  Course complicated by A. fib with RVR, cardiology consulted. Reverted to sinus rhythm. Overall improved, downgraded to cardiac telemetry 11/18.  Subjective: Patient was feeling better when seen this morning.  She was asking about discharge.  She denies any more chest pain or shortness of breath.  Assessment & Plan:   Active Problems:   AKI (acute kidney injury) ([place])   Acute respiratory failure with hypoxia ([place])   Acute cystitis without hematuria  Acute kidney injury  Nephrology consulted and assisted with evaluation and management.  Nephrology signed off 11/16.  Acute kidney injury felt to be due to ACEI/dehydration/Bactrim causing ATN most likely.  UA without significant proteinuria or hematuria to suggest glomerular disease.  Creatinine had peaked to 4 at OSH.  Received a dose of IV Lasix on night of admission here.  Creatinine peaked to 4.8.  Creatinine continue to improve, 1.29 today with baseline below 1.  Follow daily BMP.  Volume overload  As  suggested initially by left-sided pleural effusion and lower extremity edema.  TTE at outside facility showed normal LVEF.  Received IV Lasix early on in admission.  Does have some upper extremity edema but continues to improve.  Continue auto diuresis without Lasix given recent acute kidney injury.  Recent UTI  UA here appears negative.  Paroxysmal A. fib with RVR  Cardiology assisting with management.  Amiodarone and Cardizem drip have been transitioned to oral diltiazem 30 mg every 6 hourly and amiodarone 400 mg twice daily.  She has been in sinus rhythm since early morning of 11/17, continue telemetry.  She was started on Eliquis yesterday, continue Eliquis.  Acute respiratory failure with hypoxia/left pleural effusion  PCCM consulted early on in admission.  Chest x-ray 11/17: Multifocal interstitial/patchy opacities in the lungs bilaterally.  Associated moderate left pleural effusion and cardiomegaly, reflects interstitial edema.  Attempt to retrieve thoracentesis results.  Empirically started antibiotics were discontinued.  Now down to 2 L/min HFNC and saturating in the high 90s. Incentive spirometry  Chest x-ray 11/19 shows improvement in pulmonary edema and left pleural effusion appears smaller.  Auto diuresing, -4.7 L since admission   Try taking her off from oxygen as patient was not using any at home and monitor desaturation.  Type II DM with hyperglycemia  Mildly uncontrolled but acceptable with SSI.    Holding home glipizide and Actos.  Essential hypertension  Blood pressures mildly elevated.  Continue current dose of Cardizem 30 mg every 6 hourly.  Holding home lisinopril, amlodipine, Lopressor, HCTZ, consider resuming amlodipine and/or Lopressor as needed.  Hyperlipidemia  Consider resuming statins.  Dementia without behavioral abnormalities/depression  Continue  Paxil.  Hypokalemia.  Resolved Replete electrolytes as needed.   Hypomagnesemia/hypophosphatemia  Repleted  Anemia Relatively stable.   Right clavicular fracture  Noted on imaging at [facility], sustained status post fall. No pain reported. Conservative management.  Objective: Vitals:   10/03/19 0900 10/03/19 0920 10/03/19 1000 10/03/19 1100  BP: (!) 154/61 (!) 154/61 (!) 152/73   Pulse: 85  80   Resp: 17  19   Temp:    98.3 F (36.8 C)  TempSrc:    Oral  SpO2: 96%  97%   Weight:      Height:        Intake/Output Summary (Last 24 hours) at 10/03/2019 1130 Last data filed at 10/03/2019 0900 Gross per 24 hour  Intake 662.48 ml  Output 2050 ml  Net -1387.52 ml   Filed Weights   10/01/19 0328 10/02/19 0400 10/03/19 0405  Weight: 67.4 kg 68.1 kg 67.2 kg    Examination:  General exam: Appears calm and comfortable  Respiratory system: Few basal crackles bilaterally, respiratory effort normal. Cardiovascular system: S1 & S2 heard, RRR. No JVD, murmurs, rubs, gallops or clicks. No pedal edema. Gastrointestinal system: Abdomen is nondistended, soft and nontender. No organomegaly or masses felt. Normal bowel sounds heard. Central nervous system: Alert and oriented. No focal neurological deficits. Extremities: Symmetric 5 x 5 power. Skin: No rashes, lesions or ulcers Psychiatry: Judgement and insight appear normal. Mood & affect appropriate.    DVT prophylaxis: Eliquis Code Status: Full  family Communication: No family at bedside. Disposition Plan: Pending improvement, most likely in 1 to 2 days.  Consultants:   PCCM  Cardiology  Procedures:  Antimicrobials:   Data Reviewed: I have personally reviewed following labs and imaging studies  CBC: Recent Labs  Lab 09/28/19 0356 09/29/19 0946 09/30/19 0335 10/01/19 0211 10/02/19 0227 10/03/19 0332  WBC 15.3* 15.0* 13.5* 13.4* 13.8* 13.4*  NEUTROABS 12.9* 12.4* 10.8*  --   --   --   HGB 12.7 12.7 11.7* 10.7* 10.6* 10.0*  HCT 39.7 38.1 35.3* 32.7* 33.0* 31.5*   MCV 97.3 94.5 94.9 94.2 96.8 98.4  PLT 186 197 202 210 239 0000000   Basic Metabolic Panel: Recent Labs  Lab 09/28/19 0356 09/29/19 0351 09/29/19 0946 09/30/19 0335 09/30/19 1609 10/01/19 0211 10/02/19 0227 10/03/19 0332  NA 141 140  --  140 136 139 138 141  K 3.4* 3.3*  --  3.1* 3.1* 3.3* 3.5 3.7  CL 105 100  --  100 98 101 102 102  CO2 23 25  --  24 27 27 27 25   GLUCOSE 148* 238*  --  213* 191* 154* 174* 173*  BUN 42* 33*  --  22 18 16 18 18   CREATININE 4.85* 3.48*  --  2.05* 1.73* 1.50* 1.47* 1.29*  CALCIUM 7.8* 8.3*  --  8.1* 7.9* 8.1* 7.9* 8.3*  MG 1.7  --  1.6* 1.5*  --  2.1  --   --   PHOS 4.1 2.8  --  2.1* 1.6* 2.7  --   --    GFR: Estimated Creatinine Clearance: 32.7 mL/min (A) (by C-G formula based on SCr of 1.29 mg/dL (H)). Liver Function Tests: Recent Labs  Lab 09/28/19 0356 09/29/19 0351 09/30/19 0335 10/01/19 0211  AST 19  --   --   --   ALT 16  --   --   --   ALKPHOS 75  --   --   --   BILITOT 0.5  --   --   --  PROT 5.0*  --   --   --   ALBUMIN 2.2* 2.1* 2.1* 2.0*   No results for input(s): LIPASE, AMYLASE in the last 168 hours. No results for input(s): AMMONIA in the last 168 hours. Coagulation Profile: No results for input(s): INR, PROTIME in the last 168 hours. Cardiac Enzymes: No results for input(s): CKTOTAL, CKMB, CKMBINDEX, TROPONINI in the last 168 hours. BNP (last 3 results) No results for input(s): PROBNP in the last 8760 hours. HbA1C: No results for input(s): HGBA1C in the last 72 hours. CBG: Recent Labs  Lab 10/02/19 1123 10/02/19 1512 10/02/19 2148 10/03/19 0722 10/03/19 1122  GLUCAP 165* 141* 153* 179* 208*   Lipid Profile: No results for input(s): CHOL, HDL, LDLCALC, TRIG, CHOLHDL, LDLDIRECT in the last 72 hours. Thyroid Function Tests: No results for input(s): TSH, T4TOTAL, FREET4, T3FREE, THYROIDAB in the last 72 hours. Anemia Panel: No results for input(s): VITAMINB12, FOLATE, FERRITIN, TIBC, IRON, RETICCTPCT in the  last 72 hours. Sepsis Labs: Recent Labs  Lab 09/28/19 0356 09/28/19 0738  PROCALCITON 0.32  --   LATICACIDVEN 0.9 0.9    Recent Results (from the past 240 hour(s))  MRSA PCR Screening     Status: None   Collection Time: 09/28/19  2:51 AM   Specimen: Nasal Mucosa; Nasopharyngeal  Result Value Ref Range Status   MRSA by PCR NEGATIVE NEGATIVE Final    Comment:        The GeneXpert MRSA Assay (FDA approved for NASAL specimens only), is one component of a comprehensive MRSA colonization surveillance program. It is not intended to diagnose MRSA infection nor to guide or monitor treatment for MRSA infections. Performed at [facility], [place] [address] [place], [place] [address]   Respiratory Panel by PCR     Status: None   Collection Time: 09/28/19  5:00 AM   Specimen: Nasopharyngeal Swab; Respiratory  Result Value Ref Range Status   Adenovirus NOT DETECTED NOT DETECTED Final   Coronavirus 229E NOT DETECTED NOT DETECTED Final    Comment: (NOTE) The Coronavirus on the Respiratory Panel, DOES NOT test for the novel  Coronavirus (2019 nCoV)    Coronavirus HKU1 NOT DETECTED NOT DETECTED Final   Coronavirus NL63 NOT DETECTED NOT DETECTED Final   Coronavirus OC43 NOT DETECTED NOT DETECTED Final   Metapneumovirus NOT DETECTED NOT DETECTED Final   Rhinovirus / Enterovirus NOT DETECTED NOT DETECTED Final   Influenza A NOT DETECTED NOT DETECTED Final   Influenza B NOT DETECTED NOT DETECTED Final   Parainfluenza Virus 1 NOT DETECTED NOT DETECTED Final   Parainfluenza Virus 2 NOT DETECTED NOT DETECTED Final   Parainfluenza Virus 3 NOT DETECTED NOT DETECTED Final   Parainfluenza Virus 4 NOT DETECTED NOT DETECTED Final   Respiratory Syncytial Virus NOT DETECTED NOT DETECTED Final   Bordetella pertussis NOT DETECTED NOT DETECTED Final   Chlamydophila pneumoniae NOT DETECTED NOT DETECTED Final   Mycoplasma pneumoniae NOT DETECTED NOT DETECTED Final    Comment:  Performed at [facility], [place]. [address] [place], [place] [address]     Radiology Studies: Dg Chest Port 1 View  Result Date: 10/02/2019 CLINICAL DATA:  Pleural effusion. EXAM: PORTABLE CHEST 1 VIEW COMPARISON:  Radiograph 09/30/2019. CT 09/26/2019 FINDINGS: Moderate left pleural effusion slightly improved from prior exam. Small right pleural effusion is unchanged. Improving of patchy bilateral opacities with residual bronchial and interstitial thickening, likely improving edema. Cardiomegaly with unchanged mediastinal contours. No pneumothorax. IMPRESSION: 1. Moderate left pleural  effusion slightly decreased. Small right pleural effusion is unchanged. 2. Improving patchy bilateral opacities, likely improving pulmonary edema. 3. Cardiomegaly is unchanged. Electronically Signed   By: Keith Rake M.D.   On: 10/02/2019 04:25    Scheduled Meds: . amiodarone  400 mg Oral BID  . apixaban  5 mg Oral BID  . Chlorhexidine Gluconate Cloth  6 each Topical Daily  . diltiazem  30 mg Oral Q6H  . insulin aspart  0-5 Units Subcutaneous QHS  . insulin aspart  0-9 Units Subcutaneous TID WC  . magnesium oxide  400 mg Oral BID  . PARoxetine  20 mg Oral Daily  . potassium chloride  20 mEq Oral BID   Continuous Infusions:   LOS: 5 days   Time spent: 40 minutes.  I briefly reviewed of her relevant medical record.  Lorella Nimrod, MD [facility] Pager [phone]  If 7PM-7AM, please contact night-coverage [email or web address] Password [facility] 10/03/2019, 11:30 AM   This record has been created using Systems analyst. Errors have been sought and corrected,but may not always be located. Such creation errors do not reflect on the standard of care.

## 2019-10-04 LAB — BASIC METABOLIC PANEL
Anion gap: 10 (ref 5–15)
BUN: 14 mg/dL (ref 8–23)
CO2: 28 mmol/L (ref 22–32)
Calcium: 8.5 mg/dL — ABNORMAL LOW (ref 8.9–10.3)
Chloride: 103 mmol/L (ref 98–111)
Creatinine, Ser: 1.35 mg/dL — ABNORMAL HIGH (ref 0.44–1.00)
GFR calc Af Amer: 45 mL/min — ABNORMAL LOW (ref >60)
GFR calc non Af Amer: 39 mL/min — ABNORMAL LOW (ref >60)
Glucose, Bld: 200 mg/dL — ABNORMAL HIGH (ref 70–99)
Potassium: 4 mmol/L (ref 3.5–5.1)
Sodium: 141 mmol/L (ref 135–145)

## 2019-10-04 LAB — GLUCOSE, CAPILLARY
Glucose-Capillary: 196 mg/dL — ABNORMAL HIGH (ref 70–99)
Glucose-Capillary: 261 mg/dL — ABNORMAL HIGH (ref 70–99)
Glucose-Capillary: 202 mg/dL — ABNORMAL HIGH (ref 70–99)
Glucose-Capillary: 210 mg/dL — ABNORMAL HIGH (ref 70–99)

## 2019-10-04 MED ORDER — DILTIAZEM HCL ER COATED BEADS 120 MG PO CP24
120.0000 mg | ORAL_CAPSULE | Freq: Every day | ORAL | Status: DC
  Administered 2019-10-04 – 2019-10-05 (×2): 120 mg via ORAL
  Filled 2019-10-04 (×2): qty 1

## 2019-10-04 NOTE — Progress Notes (Signed)
PROGRESS NOTE    Summer Goodman  [id number] DOB: [birth year] DOA: 09/28/2019 PCP: Patient, No Pcp Per   Brief Narrative:  73 year old female with PMH of dementia, HTN, HLD, type II DM, osteoporosis, diagnosed with UTI on 11/9 and started on Bactrim, subsequently developed malaise, weakness, decreased oral intake and presented to [facility] on 11/12 with acute kidney injury, creatinine ~4 with baseline <1, admitted at [facility], started on IV Zosyn, developed hypoxia and left pleural effusion, underwent thoracentesis 650 mL out, VQ scan with low probability for PE, TTE with LVEF 60-65%, noncontrast CT chest and CT abdomen pelvis negative for obstructive uropathy, due to worsening oxygen demand (10 L by [place]) and worsening renal failure, patient was transferred to [facility] on 11/15 for nephrology and critical care consult.  Stabilized and transferred to [facility] on 11/16.  Nephrology consulted and signed off.  Course complicated by A. fib with RVR, cardiology consulted. Reverted to sinus rhythm. Overall improved, downgraded to cardiac telemetry 11/18.  Subjective: Patient was sitting on chair this morning since she was feeling better.  Was using 2 L supplemental oxygen via nasal cannula.  Denies any shortness of breath. She was asking about possible discharge on Monday.  She denies any more chest pain.  Assessment & Plan:   Active Problems:   AKI (acute kidney injury) ([place])   Acute respiratory failure with hypoxia ([facility])   Acute cystitis without hematuria   Acute respiratory distress   Recurrent pleural effusion on left  Acute kidney injury  Creatinine today of 1.35 from 1.2 yesterday.  Nephrology assisted with evaluation and management.  Nephrology signed off 11/16.  Baseline is below 1.  Acute kidney injury felt to be due to ACEI/dehydration/Bactrim causing ATN most likely.  UA without significant proteinuria or hematuria to suggest glomerular disease.  Creatinine  had peaked to 4 at OSH.  Received a dose of IV Lasix on night of admission here.  Creatinine peaked to 4.8.  Follow daily BMP.  Avoid nephrotoxins and monitor  Volume overload Continues to improve -Intake and output over the past 24 hours shows she put out 1.6 later.  Day 65 kg from 65.7 yesterday and 67.2 on the day before.  As suggested initially by left-sided pleural effusion and lower extremity edema.  TTE at outside facility showed normal LVEF.  Received IV Lasix early on in admission.  Extremity edema improving daily.  Continue auto diuresis without Lasix given recent acute kidney injury.  Recent UTI  Resolved and no acute issue   - UA here appears negative.  Paroxysmal A. fib with RVR  Cardiology assisting with management.  Amiodarone and Cardizem drip have been transitioned to oral.  As of 11/21/ 20 AM cardiology recommended: dilt to long acting 120mg  daily. Oral amio started on 11/7, would continue 400mg  bid until 11/24, then 200mg  bid x 2 weeks, then 200mg  daily.  Appreciate cardiology recs  She has been in sinus rhythm since early morning of 11/17, continue telemetry.  Had a short run of SVT when reviewed telemetry record.  Cardiology aware of the event.  Continue Eliquis.  Continue to monitor on telemetry.  Acute respiratory failure with hypoxia/left pleural effusion Improving daily  PCCM consulted early on in admission.  Chest x-ray 11/17: Multifocal interstitial/patchy opacities in the lungs bilaterally.  Associated moderate left pleural effusion and cardiomegaly, reflects interstitial edema.  S/P Thoracentesis on 11/15. Attempt to retrieve thoracentesis results - Couldn't find the result yet   Empirically antibiotics were discontinued.  Now down to 2 L/min  [place] and saturating in the high 90s. Incentive spirometry. Down to 1L/min now. Monitor. Gradual weaning off plan.   Chest x-ray 11/19 shows improvement in pulmonary edema and left pleural effusion  appears smaller. May repeat CXR.   Auto diuresing, ~ - 6 L since admission; Cont to monitor I/O and weight.   Try taking her off from oxygen as patient was not using any at home and monitor desaturation.  Type II DM with hyperglycemia  Mildly uncontrolled but acceptable with SSI.    Cont to monitor   Holding home glipizide and Actos.   Diabetic diet   Essential hypertension  Blood pressures improving but mildly elevated SBP.  Continue current dose of Cardizem as mentioned above and Lopressor.  Holding home lisinopril, amlodipine, HCTZ   Hyperlipidemia  Consider resuming statins.  Dementia without behavioral abnormalities/depression  Continue Paxil.  Hypokalemia.  Resolved Replete electrolytes as needed. - Check labs   Hypomagnesemia/hypophosphatemia  Resolved   Replete as needed   Anemia Relatively stable. Likely from AoCD  Cont to monitor   Right clavicular fracture  Not complaining of pain   Noted on imaging at [facility], sustained status post fall. No pain reported. Conservative management.   Objective: Vitals:   10/03/19 1628 10/03/19 2038 10/04/19 0440 10/04/19 1000  BP: (!) 155/60 (!) 173/69 (!) 161/68 (!) 162/61  Pulse: 76 80 89 70  Resp: 16 18 18    Temp: 98 F (36.7 C) 99.2 F (37.3 C) 99.6 F (37.6 C) 98.1 F (36.7 C)  TempSrc: Oral Oral Oral Oral  SpO2: 97% 96% 94% 98%  Weight: 65.7 kg  65 kg   Height: 5' (1.524 m)       Intake/Output Summary (Last 24 hours) at 10/04/2019 1118 Last data filed at 10/04/2019 0900 Gross per 24 hour  Intake 600 ml  Output 1750 ml  Net -1150 ml   Filed Weights   10/03/19 0405 10/03/19 1628 10/04/19 0440  Weight: 67.2 kg 65.7 kg 65 kg    Examination:  General exam: Appears calm and comfortable  Respiratory system: Few basal crackles bilaterally, respiratory effort normal. Cardiovascular system: S1 & S2 heard, RRR. No JVD, murmurs, rubs, gallops or clicks. No pedal edema.  Gastrointestinal system: Abdomen is nondistended, soft and nontender. No organomegaly or masses felt. Normal bowel sounds heard. Central nervous system: Alert and oriented. No focal neurological deficits. Extremities: Symmetric 5 x 5 power. Skin: No rashes, lesions or ulcers Psychiatry: Judgement and insight appear normal. Mood & affect appropriate.    DVT prophylaxis: Eliquis Code Status: Full  family Communication: No family at bedside. Disposition Plan: Pending improvement, most likely in 1 to 2 days.  Consultants:   PCCM  Cardiology  Procedures:  Antimicrobials:   Data Reviewed: I have personally reviewed following labs and imaging studies  CBC: Recent Labs  Lab 09/28/19 0356 09/29/19 0946 09/30/19 0335 10/01/19 0211 10/02/19 0227 10/03/19 0332  WBC 15.3* 15.0* 13.5* 13.4* 13.8* 13.4*  NEUTROABS 12.9* 12.4* 10.8*  --   --   --   HGB 12.7 12.7 11.7* 10.7* 10.6* 10.0*  HCT 39.7 38.1 35.3* 32.7* 33.0* 31.5*  MCV 97.3 94.5 94.9 94.2 96.8 98.4  PLT 186 197 202 210 239 0000000   Basic Metabolic Panel: Recent Labs  Lab 09/28/19 0356 09/29/19 0351 09/29/19 0946 09/30/19 0335 09/30/19 1609 10/01/19 0211 10/02/19 0227 10/03/19 0332 10/04/19 0401  NA 141 140  --  140 136 139 138 141 141  K 3.4* 3.3*  --  3.1* 3.1* 3.3* 3.5 3.7 4.0  CL 105 100  --  100 98 101 102 102 103  CO2 23 25  --  24 27 27 27 25 28   GLUCOSE 148* 238*  --  213* 191* 154* 174* 173* 200*  BUN 42* 33*  --  22 18 16 18 18 14   CREATININE 4.85* 3.48*  --  2.05* 1.73* 1.50* 1.47* 1.29* 1.35*  CALCIUM 7.8* 8.3*  --  8.1* 7.9* 8.1* 7.9* 8.3* 8.5*  MG 1.7  --  1.6* 1.5*  --  2.1  --   --   --   PHOS 4.1 2.8  --  2.1* 1.6* 2.7  --   --   --    GFR: Estimated Creatinine Clearance: 30.8 mL/min (A) (by C-G formula based on SCr of 1.35 mg/dL (H)). Liver Function Tests: Recent Labs  Lab 09/28/19 0356 09/29/19 0351 09/30/19 0335 10/01/19 0211  AST 19  --   --   --   ALT 16  --   --   --   ALKPHOS 75   --   --   --   BILITOT 0.5  --   --   --   PROT 5.0*  --   --   --   ALBUMIN 2.2* 2.1* 2.1* 2.0*   No results for input(s): LIPASE, AMYLASE in the last 168 hours. No results for input(s): AMMONIA in the last 168 hours. Coagulation Profile: No results for input(s): INR, PROTIME in the last 168 hours. Cardiac Enzymes: No results for input(s): CKTOTAL, CKMB, CKMBINDEX, TROPONINI in the last 168 hours. BNP (last 3 results) No results for input(s): PROBNP in the last 8760 hours. HbA1C: No results for input(s): HGBA1C in the last 72 hours. CBG: Recent Labs  Lab 10/03/19 1122 10/03/19 1539 10/03/19 2125 10/04/19 0625 10/04/19 1102  GLUCAP 208* 221* 149* 196* 261*   Lipid Profile: No results for input(s): CHOL, HDL, LDLCALC, TRIG, CHOLHDL, LDLDIRECT in the last 72 hours. Thyroid Function Tests: No results for input(s): TSH, T4TOTAL, FREET4, T3FREE, THYROIDAB in the last 72 hours. Anemia Panel: No results for input(s): VITAMINB12, FOLATE, FERRITIN, TIBC, IRON, RETICCTPCT in the last 72 hours. Sepsis Labs: Recent Labs  Lab 09/28/19 0356 09/28/19 0738  PROCALCITON 0.32  --   LATICACIDVEN 0.9 0.9    Recent Results (from the past 240 hour(s))  MRSA PCR Screening     Status: None   Collection Time: 09/28/19  2:51 AM   Specimen: Nasal Mucosa; Nasopharyngeal  Result Value Ref Range Status   MRSA by PCR NEGATIVE NEGATIVE Final    Comment:        The GeneXpert MRSA Assay (FDA approved for NASAL specimens only), is one component of a comprehensive MRSA colonization surveillance program. It is not intended to diagnose MRSA infection nor to guide or monitor treatment for MRSA infections. Performed at [facility], [place] [address] [place], [place] [address]   Respiratory Panel by PCR     Status: None   Collection Time: 09/28/19  5:00 AM   Specimen: Nasopharyngeal Swab; Respiratory  Result Value Ref Range Status   Adenovirus NOT DETECTED NOT DETECTED Final    Coronavirus 229E NOT DETECTED NOT DETECTED Final    Comment: (NOTE) The Coronavirus on the Respiratory Panel, DOES NOT test for the novel  Coronavirus (2019 nCoV)    Coronavirus HKU1 NOT DETECTED NOT DETECTED Final   Coronavirus NL63 NOT DETECTED NOT DETECTED  Final   Coronavirus OC43 NOT DETECTED NOT DETECTED Final   Metapneumovirus NOT DETECTED NOT DETECTED Final   Rhinovirus / Enterovirus NOT DETECTED NOT DETECTED Final   Influenza A NOT DETECTED NOT DETECTED Final   Influenza B NOT DETECTED NOT DETECTED Final   Parainfluenza Virus 1 NOT DETECTED NOT DETECTED Final   Parainfluenza Virus 2 NOT DETECTED NOT DETECTED Final   Parainfluenza Virus 3 NOT DETECTED NOT DETECTED Final   Parainfluenza Virus 4 NOT DETECTED NOT DETECTED Final   Respiratory Syncytial Virus NOT DETECTED NOT DETECTED Final   Bordetella pertussis NOT DETECTED NOT DETECTED Final   Chlamydophila pneumoniae NOT DETECTED NOT DETECTED Final   Mycoplasma pneumoniae NOT DETECTED NOT DETECTED Final    Comment: Performed at [facility], [place] [address], [place], [place] [address]     Radiology Studies: No results found.  Scheduled Meds: . amiodarone  400 mg Oral BID  . apixaban  5 mg Oral BID  . Chlorhexidine Gluconate Cloth  6 each Topical Daily  . diltiazem  120 mg Oral Daily  . insulin aspart  0-5 Units Subcutaneous QHS  . insulin aspart  0-9 Units Subcutaneous TID WC  . magnesium oxide  400 mg Oral BID  . PARoxetine  20 mg Oral Daily  . potassium chloride  20 mEq Oral BID   Continuous Infusions:   LOS: 6 days   Time spent: 40 minutes.  I briefly reviewed of her relevant medical record.  Thornell Mule, MD [facility] Pager [phone]  If 7PM-7AM, please contact night-coverage [email or web address] Password [facility] 10/04/2019, 11:18 AM   This record has been created using Systems analyst. Errors have been sought and corrected,but may not always be located. Such creation errors do  not reflect on the standard of care.

## 2019-10-04 NOTE — TOC Progression Note (Signed)
Transition of Care [facility]) - Progression Note    Patient Details  Name: Summer Goodman MRN: [id number] Date of Birth: 07-11-1946  Transition of Care [facility]) CM/SW [place], LCSW Phone Number: 10/04/2019, 10:10 AM  Clinical Narrative:    CSW confirmed bed acceptance by [facility]. They will be able to accept patient on Monday pending insurance approval. Insurance requires updated PT evaluation. COVID test required 24-48 hours prior to discharge.    Expected Discharge Plan: [place] Barriers to Discharge: SNF Pending bed offer, Continued Medical Work up  Expected Discharge Plan and Services Expected Discharge Plan: [place]                                               Social Determinants of Health (SDOH) Interventions    Readmission Risk Interventions No flowsheet data found.

## 2019-10-04 NOTE — Progress Notes (Signed)
Prior cardiology rounding note from 11/18 reviewed. She is on amio 400mg  bid and dilt 30mg  every 6 hours along with eliquis for her afib. BPs are tolerating regimen and actually elevated. Tele shows sinus rhythm, short run of SVT. Consolidate dilt to long acting 120mg  daily. Oral amio started on 11/7, would continue 400mg  bid until 11/24, then 200mg  bid x 2 weeks, then 200mg  daily.No additional recs today, will follow up tele tomorrow.    Carlyle Dolly MD

## 2019-10-05 ENCOUNTER — Inpatient Hospital Stay (HOSPITAL_COMMUNITY): Payer: Medicare Other

## 2019-10-05 LAB — GLUCOSE, CAPILLARY
Glucose-Capillary: 184 mg/dL — ABNORMAL HIGH (ref 70–99)
Glucose-Capillary: 188 mg/dL — ABNORMAL HIGH (ref 70–99)
Glucose-Capillary: 180 mg/dL — ABNORMAL HIGH (ref 70–99)
Glucose-Capillary: 196 mg/dL — ABNORMAL HIGH (ref 70–99)

## 2019-10-05 LAB — BASIC METABOLIC PANEL
Anion gap: 12 (ref 5–15)
BUN: 14 mg/dL (ref 8–23)
CO2: 25 mmol/L (ref 22–32)
Calcium: 8.6 mg/dL — ABNORMAL LOW (ref 8.9–10.3)
Chloride: 106 mmol/L (ref 98–111)
Creatinine, Ser: 1.27 mg/dL — ABNORMAL HIGH (ref 0.44–1.00)
GFR calc Af Amer: 48 mL/min — ABNORMAL LOW (ref >60)
GFR calc non Af Amer: 42 mL/min — ABNORMAL LOW (ref >60)
Glucose, Bld: 185 mg/dL — ABNORMAL HIGH (ref 70–99)
Potassium: 3.9 mmol/L (ref 3.5–5.1)
Sodium: 143 mmol/L (ref 135–145)

## 2019-10-05 LAB — CBC
HCT: 33.6 % — ABNORMAL LOW (ref 36.0–46.0)
Hemoglobin: 10.5 g/dL — ABNORMAL LOW (ref 12.0–15.0)
MCH: 31.3 pg (ref 26.0–34.0)
MCHC: 31.3 g/dL (ref 30.0–36.0)
MCV: 100.3 fL — ABNORMAL HIGH (ref 80.0–100.0)
Platelets: 276 10*3/uL (ref 150–400)
RBC: 3.35 MIL/uL — ABNORMAL LOW (ref 3.87–5.11)
RDW: 14.2 % (ref 11.5–15.5)
WBC: 12.5 10*3/uL — ABNORMAL HIGH (ref 4.0–10.5)
nRBC: 0 % (ref 0.0–0.2)

## 2019-10-05 MED ORDER — METOPROLOL TARTRATE 5 MG/5ML IV SOLN: 5.0000 mg | Freq: Four times a day (QID) | INTRAVENOUS | Status: DC | PRN

## 2019-10-05 MED ORDER — DILTIAZEM HCL ER COATED BEADS 180 MG PO CP24
180.0000 mg | ORAL_CAPSULE | Freq: Every day | ORAL | Status: DC
  Administered 2019-10-06: 180 mg via ORAL
  Filled 2019-10-05: qty 1

## 2019-10-05 NOTE — Progress Notes (Signed)
Physical Therapy Treatment Patient Details Name: Summer Goodman MRN: [id number] DOB: [birth year] Today's Date: 10/05/2019    History of Present Illness 73 year old female with recent diagnosis of UTI who was started on Bactrim and subsequently developed acute kidney injury and admitted to [facility].  She was resuscitated with fluid with no improvement in her renal function however she developed volume overload with resulting hypoxic respiratory failure and large left-sided pleural effusion.  Underwent thoracentesis of 650 mL fluid and transferred to [facility] ICU 09/28/19. 11/17 episodes of SVT and Afib.    PT Comments    Pt tolerated treatment well, demonstrating much improved functional mobility and gait. Pt only requiring miA during stair negotiation and is able to ambulate and transfer with supervision and use of RW. Pt also withy much improved tolerance for functional activity during session. Pt is now appropriate for discharge home with 24/7 supervision/assistance of spouse and use of RW. Pt will also benefit from HHPT.  Follow Up Recommendations  Home health PT;Supervision/Assistance - 24 hour     Equipment Recommendations  None recommended by PT(pt owns RW)    Recommendations for Other Services       Precautions / Restrictions Precautions Precautions: Fall Precaution Comments: hx of falling  Restrictions Weight Bearing Restrictions: No    Mobility  Bed Mobility Overal bed mobility: (pt sitting at edge of bed upon PT arrival)                Transfers Overall transfer level: Needs assistance Equipment used: Rolling walker (2 wheeled) Transfers: Sit to/from Stand Sit to Stand: Supervision            Ambulation/Gait Ambulation/Gait assistance: Supervision Gait Distance (Feet): 100 Feet Assistive device: Rolling walker (2 wheeled) Gait Pattern/deviations: Step-through pattern Gait velocity: slowed Gait velocity interpretation: 1.31 - 2.62 ft/sec,  indicative of limited community ambulator General Gait Details: steady step through gait with reduced gait speed   Stairs             Wheelchair Mobility    Modified Rankin (Stroke Patients Only)       Balance Overall balance assessment: Needs assistance Sitting-balance support: No upper extremity supported;Feet supported Sitting balance-Leahy Scale: Good Sitting balance - Comments: independent   Standing balance support: Bilateral upper extremity supported Standing balance-Leahy Scale: Good Standing balance comment: supervision                            Cognition Arousal/Alertness: Awake/alert Behavior During Therapy: WFL for tasks assessed/performed Overall Cognitive Status: History of cognitive impairments - at baseline                                        Exercises      General Comments General comments (skin integrity, edema, etc.): VSS on RA      Pertinent Vitals/Pain Pain Assessment: No/denies pain    Home Living                      Prior Function            PT Goals (current goals can now be found in the care plan section) Acute Rehab PT Goals Patient Stated Goal: see her dog Millie Progress towards PT goals: Progressing toward goals    Frequency    Min 3X/week      PT Plan Discharge  plan needs to be updated    Co-evaluation              AM-PAC PT "6 Clicks" Mobility   Outcome Measure  Help needed turning from your back to your side while in a flat bed without using bedrails?: None Help needed moving from lying on your back to sitting on the side of a flat bed without using bedrails?: A Little Help needed moving to and from a bed to a chair (including a wheelchair)?: None Help needed standing up from a chair using your arms (e.g., wheelchair or bedside chair)?: None Help needed to walk in hospital room?: None Help needed climbing 3-5 steps with a railing? : A Little 6 Click Score:  22    End of Session Equipment Utilized During Treatment: (none) Activity Tolerance: Patient tolerated treatment well Patient left: in chair;with call bell/phone within reach Nurse Communication: Mobility status PT Visit Diagnosis: Unsteadiness on feet (R26.81);Other abnormalities of gait and mobility (R26.89);Repeated falls (R29.6);Muscle weakness (generalized) (M62.81);History of falling (Z91.81);Difficulty in walking, not elsewhere classified (R26.2)     Time: [id number] PT Time Calculation (min) (ACUTE ONLY): 15 min  Charges:  $Gait Training: 8-22 mins                     Zenaida Niece, PT, DPT Acute Rehabilitation Pager: [phone]    Zenaida Niece 10/05/2019, 9:48 AM

## 2019-10-05 NOTE — Progress Notes (Signed)
Telemetry reviewed, remains in SR. No additional recs over the weekend, please call with questions.   Carlyle Dolly MD

## 2019-10-05 NOTE — Progress Notes (Signed)
PROGRESS NOTE    Summer Goodman  [id number] DOB: 30-Mar-1946 DOA: 09/28/2019 PCP: Patient, No Pcp Per   Brief Narrative:  73 year old female with PMH of dementia, HTN, HLD, type II DM, osteoporosis, diagnosed with UTI on 11/9 and started on Bactrim, subsequently developed malaise, weakness, decreased oral intake and presented to [facility] on 11/12 with acute kidney injury, creatinine ~4 with baseline <1, admitted at [facility], started on IV Zosyn, developed hypoxia and left pleural effusion, underwent thoracentesis 650 mL out, VQ scan with low probability for PE, TTE with LVEF 60-65%, noncontrast CT chest and CT abdomen pelvis negative for obstructive uropathy, due to worsening oxygen demand (10 L by [place]) and worsening renal failure, patient was transferred to [facility] on 11/15 for nephrology and critical care consult.  Stabilized and transferred to [facility] on 11/16.  Nephrology consulted and signed off.  Course complicated by A. fib with RVR, cardiology consulted. Reverted to sinus rhythm. Overall improved, downgraded to cardiac telemetry 11/18.  Subjective: Patient was sitting on chair this morning since she was feeling better.  Was on room air, which she has been on since earlier this morning.  Patient was ambulating with PT on room air slightly in the room.  Denies any shortness of breath. She was asking about possible discharge on Monday.  She denies any more chest pain.  Assessment & Plan:   Active Problems:   AKI (acute kidney injury) ([place])   Acute respiratory failure with hypoxia (HCC)   Acute cystitis without hematuria   Acute respiratory distress   Recurrent pleural effusion on left  Acute kidney injury  Creatinine today 1.27 from yesterday's 1.35.Marland Kitchen  Nephrology assisted with evaluation and management.  Nephrology signed off 11/16.  Baseline is below 1.  Continue to monitor auto diuresis  Acute kidney injury felt to be due to ACEI/dehydration/Bactrim  causing ATN most likely.  UA without significant proteinuria or hematuria to suggest glomerular disease.  Creatinine had peaked to 4 at OSH.  Received a dose of IV Lasix on night of admission here.  Creatinine peaked to 4.8.  Follow daily BMP.  Avoid nephrotoxins and monitor  Volume overload Continues to improve -Intake and output over the past 24 hours shows she put out 950cc. 65.4kg today.  As suggested initially by left-sided pleural effusion and lower extremity edema. Repeat X-ray shows much improvement   TTE at outside facility showed normal LVEF.  Received IV Lasix early on in admission.  Extremity edema improving daily.  Continue auto diuresis without Lasix given recent acute kidney injury.  Recent UTI  Resolved and no acute issue   - UA here appears negative.  Paroxysmal A. fib with RVR  Cardiology assisting with management.  Amiodarone and Cardizem drip have been transitioned to oral.  As of 11/21/ 20 AM cardiology recommended: dilt to long acting 120mg  daily. Oral amio started on 11/7, would continue 400mg  bid until 11/24, then 200mg  bid x 2 weeks, then 200mg  daily.  Appreciate cardiology recs  She has been in sinus rhythm since early morning of 11/17, continue telemetry.  Had a short run of SVT on 11/20 night when reviewed telemetry record.  Cardiology aware of the event.  Continue Eliquis.  Continue to monitor on telemetry.  Acute respiratory failure with hypoxia/left pleural effusion Improving daily  PCCM consulted early on in admission.  Chest x-ray 11/17: Multifocal interstitial/patchy opacities in the lungs bilaterally.  Associated moderate left pleural effusion and cardiomegaly, reflects interstitial edema.  S/P Thoracentesis on  11/15. Attempt to retrieve thoracentesis results - Couldn't find the result yet   Empirically antibiotics were discontinued.  Now down to 2 L/min  [place] and saturating in the high 90s. Incentive spirometry. Down to 1L/min now.  Monitor. Gradual weaning off plan.   Chest x-ray 11/19 shows improvement in pulmonary edema and left pleural effusion appears smaller. May repeat CXR.   Auto diuresing, ~ - 6 L since admission; Cont to monitor I/O and weight.   Try taking her off from oxygen as patient was not using any at home and monitor desaturation.  Type II DM with hyperglycemia  Mildly uncontrolled but acceptable with SSI.    Cont to monitor   Holding home glipizide and Actos.   Diabetic diet   Essential hypertension  Blood pressures improving but mildly elevated SBP.  Continue current dose of Cardizem as mentioned above and Lopressor.  Holding home lisinopril, amlodipine, HCTZ   Hyperlipidemia  Consider resuming statins.  Dementia without behavioral abnormalities/depression  Continue Paxil.  Hypokalemia.  Resolved Replete electrolytes as needed. - Check labs   Hypomagnesemia/hypophosphatemia  Resolved   Replete as needed   Anemia Relatively stable. Likely from AoCD  Cont to monitor   Right clavicular fracture  Not complaining of pain   Noted on imaging at [facility], sustained status post fall. No pain reported. Conservative management.   Disposition: Patient is doing okay with the PT OT as per PT note. However PT recommended patient be discharged home with possible home health care PT or skilled nursing facility which patient refuses. Case manager consulted for possible home health care nursing and PT arrangement. May discharge tomorrow if everything can be arranged while patient continues to do better.  Objective: Vitals:   10/04/19 1140 10/04/19 2001 10/05/19 0503 10/05/19 0514  BP: (!) 154/73 (!) 156/67 (!) 174/74   Pulse: 83 83 96   Resp: 16 18 20    Temp: 97.8 F (36.6 C) 99.2 F (37.3 C) 98.7 F (37.1 C)   TempSrc: Oral Oral Oral   SpO2: 96% 95% 92%   Weight:    65.4 kg  Height:        Intake/Output Summary (Last 24 hours) at 10/05/2019 0825 Last data  filed at 10/05/2019 0700 Gross per 24 hour  Intake -  Output 950 ml  Net -950 ml   Filed Weights   10/03/19 1628 10/04/19 0440 10/05/19 0514  Weight: 65.7 kg 65 kg 65.4 kg    Examination:  General exam: Appears calm and comfortable  Respiratory system: Few basal crackles bilaterally, respiratory effort normal. Cardiovascular system: S1 & S2 heard, RRR. No JVD, murmurs, rubs, gallops or clicks. No pedal edema. Gastrointestinal system: Abdomen is nondistended, soft and nontender. No organomegaly or masses felt. Normal bowel sounds heard. Central nervous system: Alert and oriented. No focal neurological deficits. Extremities: Symmetric 5 x 5 power. Skin: No rashes, lesions or ulcers Psychiatry: Judgement and insight appear normal. Mood & affect appropriate.    DVT prophylaxis: Eliquis Code Status: Full  family Communication: No family at bedside. Disposition Plan: Pending improvement, most likely in 1 to 2 days.  Consultants:   PCCM  Cardiology  Procedures:  Antimicrobials:   Data Reviewed: I have personally reviewed following labs and imaging studies  CBC: Recent Labs  Lab 09/29/19 0946 09/30/19 0335 10/01/19 0211 10/02/19 0227 10/03/19 0332  WBC 15.0* 13.5* 13.4* 13.8* 13.4*  NEUTROABS 12.4* 10.8*  --   --   --   HGB 12.7 11.7* 10.7* 10.6*  10.0*  HCT 38.1 35.3* 32.7* 33.0* 31.5*  MCV 94.5 94.9 94.2 96.8 98.4  PLT 197 202 210 239 0000000   Basic Metabolic Panel: Recent Labs  Lab 09/29/19 0351 09/29/19 0946 09/30/19 0335 09/30/19 1609 10/01/19 0211 10/02/19 0227 10/03/19 0332 10/04/19 0401  NA 140  --  140 136 139 138 141 141  K 3.3*  --  3.1* 3.1* 3.3* 3.5 3.7 4.0  CL 100  --  100 98 101 102 102 103  CO2 25  --  24 27 27 27 25 28   GLUCOSE 238*  --  213* 191* 154* 174* 173* 200*  BUN 33*  --  22 18 16 18 18 14   CREATININE 3.48*  --  2.05* 1.73* 1.50* 1.47* 1.29* 1.35*  CALCIUM 8.3*  --  8.1* 7.9* 8.1* 7.9* 8.3* 8.5*  MG  --  1.6* 1.5*  --  2.1  --    --   --   PHOS 2.8  --  2.1* 1.6* 2.7  --   --   --    GFR: Estimated Creatinine Clearance: 30.9 mL/min (A) (by C-G formula based on SCr of 1.35 mg/dL (H)). Liver Function Tests: Recent Labs  Lab 09/29/19 0351 09/30/19 0335 10/01/19 0211  ALBUMIN 2.1* 2.1* 2.0*   No results for input(s): LIPASE, AMYLASE in the last 168 hours. No results for input(s): AMMONIA in the last 168 hours. Coagulation Profile: No results for input(s): INR, PROTIME in the last 168 hours. Cardiac Enzymes: No results for input(s): CKTOTAL, CKMB, CKMBINDEX, TROPONINI in the last 168 hours. BNP (last 3 results) No results for input(s): PROBNP in the last 8760 hours. HbA1C: No results for input(s): HGBA1C in the last 72 hours. CBG: Recent Labs  Lab 10/04/19 0625 10/04/19 1102 10/04/19 1603 10/04/19 2111 10/05/19 0558  GLUCAP 196* 261* 202* 210* 184*   Lipid Profile: No results for input(s): CHOL, HDL, LDLCALC, TRIG, CHOLHDL, LDLDIRECT in the last 72 hours. Thyroid Function Tests: No results for input(s): TSH, T4TOTAL, FREET4, T3FREE, THYROIDAB in the last 72 hours. Anemia Panel: No results for input(s): VITAMINB12, FOLATE, FERRITIN, TIBC, IRON, RETICCTPCT in the last 72 hours. Sepsis Labs: No results for input(s): PROCALCITON, LATICACIDVEN in the last 168 hours.  Recent Results (from the past 240 hour(s))  MRSA PCR Screening     Status: None   Collection Time: 09/28/19  2:51 AM   Specimen: Nasal Mucosa; Nasopharyngeal  Result Value Ref Range Status   MRSA by PCR NEGATIVE NEGATIVE Final    Comment:        The GeneXpert MRSA Assay (FDA approved for NASAL specimens only), is one component of a comprehensive MRSA colonization surveillance program. It is not intended to diagnose MRSA infection nor to guide or monitor treatment for MRSA infections. Performed at [facility], [place] [address] [place],  [address]   Respiratory Panel by PCR     Status: None   Collection  Time: 09/28/19  5:00 AM   Specimen: Nasopharyngeal Swab; Respiratory  Result Value Ref Range Status   Adenovirus NOT DETECTED NOT DETECTED Final   Coronavirus 229E NOT DETECTED NOT DETECTED Final    Comment: (NOTE) The Coronavirus on the Respiratory Panel, DOES NOT test for the novel  Coronavirus (2019 nCoV)    Coronavirus HKU1 NOT DETECTED NOT DETECTED Final   Coronavirus NL63 NOT DETECTED NOT DETECTED Final   Coronavirus OC43 NOT DETECTED NOT DETECTED Final   Metapneumovirus NOT DETECTED NOT DETECTED Final   Rhinovirus /  Enterovirus NOT DETECTED NOT DETECTED Final   Influenza A NOT DETECTED NOT DETECTED Final   Influenza B NOT DETECTED NOT DETECTED Final   Parainfluenza Virus 1 NOT DETECTED NOT DETECTED Final   Parainfluenza Virus 2 NOT DETECTED NOT DETECTED Final   Parainfluenza Virus 3 NOT DETECTED NOT DETECTED Final   Parainfluenza Virus 4 NOT DETECTED NOT DETECTED Final   Respiratory Syncytial Virus NOT DETECTED NOT DETECTED Final   Bordetella pertussis NOT DETECTED NOT DETECTED Final   Chlamydophila pneumoniae NOT DETECTED NOT DETECTED Final   Mycoplasma pneumoniae NOT DETECTED NOT DETECTED Final    Comment: Performed at [facility], [place] [address], [place], [place] [address]     Radiology Studies: No results found.  Scheduled Meds: . amiodarone  400 mg Oral BID  . apixaban  5 mg Oral BID  . Chlorhexidine Gluconate Cloth  6 each Topical Daily  . diltiazem  120 mg Oral Daily  . insulin aspart  0-5 Units Subcutaneous QHS  . insulin aspart  0-9 Units Subcutaneous TID WC  . magnesium oxide  400 mg Oral BID  . PARoxetine  20 mg Oral Daily  . potassium chloride  20 mEq Oral BID   Continuous Infusions:   LOS: 7 days   Time spent: 40 minutes.  I briefly reviewed of her relevant medical record.  Thornell Mule, MD [facility] Pager [phone]  If 7PM-7AM, please contact night-coverage [email or web address] Password [facility] 10/05/2019, 8:25 AM   This record  has been created using Systems analyst. Errors have been sought and corrected,but may not always be located. Such creation errors do not reflect on the standard of care.

## 2019-10-06 LAB — GLUCOSE, CAPILLARY
Glucose-Capillary: 207 mg/dL — ABNORMAL HIGH (ref 70–99)
Glucose-Capillary: 205 mg/dL — ABNORMAL HIGH (ref 70–99)

## 2019-10-06 MED ORDER — AMIODARONE HCL 200 MG PO TABS: 200.0000 mg | ORAL_TABLET | Freq: Every day | ORAL | 0 refills | Status: AC

## 2019-10-06 MED ORDER — AMIODARONE HCL 200 MG PO TABS: 200.0000 mg | ORAL_TABLET | Freq: Every day | ORAL | 0 refills | Status: DC

## 2019-10-06 MED ORDER — POTASSIUM CHLORIDE CRYS ER 20 MEQ PO TBCR: 20.0000 meq | EXTENDED_RELEASE_TABLET | Freq: Two times a day (BID) | ORAL | 0 refills | Status: DC

## 2019-10-06 MED ORDER — APIXABAN 5 MG PO TABS: 5.0000 mg | ORAL_TABLET | Freq: Two times a day (BID) | ORAL | 0 refills | Status: DC

## 2019-10-06 MED ORDER — DILTIAZEM HCL ER COATED BEADS 180 MG PO CP24: 180.0000 mg | ORAL_CAPSULE | Freq: Every day | ORAL | 0 refills | Status: DC

## 2019-10-06 MED ORDER — APIXABAN 5 MG PO TABS: 5.0000 mg | ORAL_TABLET | Freq: Two times a day (BID) | ORAL | 0 refills | Status: AC

## 2019-10-06 MED ORDER — AMIODARONE HCL 200 MG PO TABS: 200.0000 mg | ORAL_TABLET | Freq: Two times a day (BID) | ORAL | 0 refills | Status: DC

## 2019-10-06 NOTE — Discharge Summary (Signed)
Physician Discharge Summary  Summer Goodman [id number] DOB: 01/07/1946 DOA: 09/28/2019  PCP: Patient, No Pcp Per  Admit date: 09/28/2019 Discharge date: 10/06/2019  Admitted From: Home Disposition:  Home  Recommendations for Outpatient Follow-up:  1. Follow up with PCP in 1-2 weeks 2. Follow up with Cardiology as scheduled  Home Health:PT   Discharge Condition:Stable CODE STATUS:Full Diet recommendation: Heart healthy   Brief/Interim Summary: 73 year old female with PMH of dementia, HTN, HLD, type II DM, osteoporosis, diagnosed with UTI on 11/9 and started on Bactrim, subsequently developed malaise, weakness, decreased oral intake and presented to [facility] on 11/12 with acute kidney injury, creatinine ~4 with baseline <1, admitted at [facility], started on IV Zosyn, developed hypoxia and left pleural effusion, underwent thoracentesis 650 mL out, VQ scan with low probability for PE, TTE with LVEF 60-65%, noncontrast CT chest and CT abdomen pelvis negative for obstructive uropathy, due to worsening oxygen demand (10 L by [place]) and worsening renal failure, patient was transferred to [facility] on 11/15 for nephrology and critical care consult. Stabilized and transferred to [facility] on 11/16. Nephrology consultedand signed off. Course complicated by A. fib with RVR, cardiology consulted.Reverted to sinus rhythm. Overall improved, downgraded to cardiac telemetry 11/18.  Discharge Diagnoses:  Active Problems:   AKI (acute kidney injury) ([place])   Acute respiratory failure with hypoxia (HCC)   Acute cystitis without hematuria   Acute respiratory distress   Recurrent pleural effusion on left  Acute kidney injury  Cr improved and pt voiding well  Acute kidney injury felt to be due to ACEI/dehydration/Bactrim causing ATN most likely. UA without significant proteinuria or hematuria to suggest glomerular disease.  Volume overload Continues to improve  As  suggested initially by left-sided pleural effusion and lower extremity edema. Repeat X-ray shows much improvement   TTE at outside facility showed normal LVEF.  Much improved  Recent UTI  Resolved and no acute issue   UA here appears negative.  Paroxysmal A. fib with RVR  Cardiology assisting with management.  Amiodarone and Cardizem drip have been transitioned to oral.  As of 11/21/ 20 AM cardiology recommended: dilt to long acting 120mg  daily. Oral amio started on 11/7, would continue 400mg  bid until 11/24, then 200mg  bid x 2 weeks, then 200mg  daily.  Appreciate cardiology recs  Continue Eliquis.  Continue to monitor on telemetry.  Acute respiratory failure with hypoxia/left pleural effusion Improving daily  PCCM consulted early on in admission.  Chest x-ray 11/17: Multifocal interstitial/patchy opacities in the lungs bilaterally. Associated moderate left pleural effusion and cardiomegaly, reflects interstitial edema.  S/P Thoracentesis on 11/15. Attempt to retrieve thoracentesis results - Couldn't find the result yet   Empirically antibiotics were discontinued.  Auto diuresing,~ - 6.9 L since admission; Cont to monitor I/O and weight.   Weaned to room air  Type II DM with hyperglycemia  Mildly uncontrolled but acceptable with SSI.   Cont to monitor   Holding home glipizide and Actos.   Stable at this time  Essential hypertension  Blood pressures improving but mildly elevated SBP. Continue current dose of Cardizem as mentioned above and Lopressor.  Holding home lisinopril, amlodipine, HCTZ   Stable  Hyperlipidemia  Consider resuming statins.  Dementia without behavioral abnormalities/depression  Continue Paxil.  Hypokalemia.  Resolved Replaced  Hypomagnesemia/hypophosphatemia  Resolved   Anemia Relatively stable. 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 No pain reported. Conservative management.   Discharge Instructions   Allergies as of 10/06/2019      Reactions   Metformin And Related Diarrhea      Medication List    STOP taking these medications   amLODipine 10 MG tablet Commonly known as: NORVASC   hydrochlorothiazide 25 MG tablet Commonly known as: HYDRODIURIL   lisinopril 40 MG tablet Commonly known as: ZESTRIL   lovastatin 20 MG tablet Commonly known as: MEVACOR   metoprolol tartrate 100 MG tablet Commonly known as: LOPRESSOR   oxybutynin 10 MG 24 hr tablet Commonly known as: DITROPAN-XL   phenazopyridine 200 MG tablet Commonly known as: PYRIDIUM   potassium chloride 10 MEQ tablet Commonly known as: KLOR-CON   sulfamethoxazole-trimethoprim 800-160 MG tablet Commonly known as: BACTRIM DS   venlafaxine XR 75 MG 24 hr capsule Commonly known as: EFFEXOR-XR     TAKE these medications   amiodarone 200 MG tablet Commonly known as: Pacerone Take 1 tablet (200 mg total) by mouth 2 (two) times daily for 14 days. Take 1 tab bid x 14 days, then take 1 tab daily afterwards Start taking on: October 07, 2019   amiodarone 200 MG tablet Commonly known as: Pacerone Take 1 tablet (200 mg total) by mouth daily. Take 1 tab bid x 14 days, then take 1 tab daily afterwards Start taking on: October 22, 2019   apixaban 5 MG Tabs tablet Commonly known as: ELIQUIS Take 1 tablet (5 mg total) by mouth 2 (two) times daily.   clonazePAM 2 MG tablet Commonly known as: KLONOPIN Take 2 mg by mouth.   diltiazem 180 MG 24 hr capsule Commonly known as: CARDIZEM CD Take 1 capsule (180 mg total) by mouth daily. Start taking on: October 07, 2019   glipiZIDE 5 MG tablet Commonly known as: GLUCOTROL Take 5 mg by mouth 2 (two) times daily.   PARoxetine 20 MG tablet Commonly known as: PAXIL Take 20 mg by mouth daily.   pioglitazone 30 MG tablet Commonly known as: ACTOS Take 30  mg by mouth daily.   potassium chloride SA 20 MEQ tablet Commonly known as: KLOR-CON Take 1 tablet (20 mEq total) by mouth 2 (two) times daily.       Contact information for follow-up providers    Tobb, Kardie, DO Follow up on 10/16/2019.   Specialty: Cardiology Why: at 2:15 PM  Contact information: [place] [address] [address] [facility] Follow up.   Specialty: Home Health Services Why: [facility] will reach out to schedule PT sessions.  Contact information: [address] [place] [address] [phone]        Follow up with PCP in 1-2 weeks. Schedule an appointment as soon as possible for a visit in 1 week(s).            Contact information for after-discharge care    Destination    [facility] .   Service: Skilled Nursing Contact information: [address] [place] [phone]                 Allergies  Allergen Reactions  . Metformin And Related Diarrhea    Consultations:  PCCM  Cardiology  Procedures/Studies: US Renal  Result Date: 09/28/2019 CLINICAL DATA:  Initial evaluation for acute renal insufficiency. EXAM: RENAL / URINARY TRACT ULTRASOUND COMPLETE COMPARISON:  Prior CT from 09/25/2019. FINDINGS: Right  Kidney: Renal measurements: 11.0 x 3.9 x 4.6 cm = volume: 101.1 mL. Echogenicity within normal limits. No nephrolithiasis or hydronephrosis. 9 mm echogenic lesion within the mid-lower right kidney consistent with previously identified small AML. Small extrarenal pelvis noted. Left Kidney: Renal measurements: 11.0 x 4.2 x 3.3 cm = volume: 78.8 mL. Echogenicity within normal limits. No mass or hydronephrosis visualized. Bladder: Decompressed with a Foley catheter in place. Other: None. IMPRESSION: 1. No hydronephrosis or other acute finding. 2. 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 Electronically Signed   By: Jeannine Boga M.D.   On:  09/28/2019 13:44   Dg Chest Port 1 View  Result Date: 10/05/2019 CLINICAL DATA:  Pleural effusion EXAM: PORTABLE CHEST 1 VIEW COMPARISON:  Three days ago FINDINGS: Opacity at the left base has the appearance of both pleural fluid and airspace disease and is unchanged. Normal heart size. Aortic tortuosity. Nondisplaced lateral right clavicle fracture. IMPRESSION: 1. Unchanged pleural and parenchymal opacity at the left base. 2. Nondisplaced distal right clavicle fracture. Electronically Signed   By: Monte Fantasia M.D.   On: 10/05/2019 12:31   Dg Chest Port 1 View  Result Date: 10/02/2019 CLINICAL DATA:  Pleural effusion. EXAM: PORTABLE CHEST 1 VIEW COMPARISON:  Radiograph 09/30/2019. CT 09/26/2019 FINDINGS: Moderate left pleural effusion slightly improved from prior exam. Small right pleural effusion is unchanged. Improving of patchy bilateral opacities with residual bronchial and interstitial thickening, likely improving edema. Cardiomegaly with unchanged mediastinal contours. No pneumothorax. IMPRESSION: 1. Moderate left pleural effusion slightly decreased. Small right pleural effusion is unchanged. 2. Improving patchy bilateral opacities, likely improving pulmonary edema. 3. Cardiomegaly is unchanged. Electronically Signed   By: Keith Rake M.D.   On: 10/02/2019 04:25   Dg Chest Port 1 View  Result Date: 09/30/2019 CLINICAL DATA:  Dyspnea, weakness EXAM: PORTABLE CHEST 1 VIEW COMPARISON:  09/28/2019 FINDINGS: Multifocal interstitial/patchy opacities in the lungs bilaterally. Moderate left pleural effusion, grossly unchanged. Associated left lower lobe atelectasis. No pneumothorax. Cardiomegaly.  Thoracic aortic atherosclerosis. IMPRESSION: Multifocal interstitial/patchy opacities in the lungs bilaterally. Given associated moderate left pleural effusion and cardiomegaly, this appearance likely reflects moderate interstitial edema. However, in the appropriate clinical setting, multifocal  infection/pneumonia is not excluded. Electronically Signed   By: Julian Hy M.D.   On: 09/30/2019 10:05   Dg Chest Port 1 View  Result Date: 09/28/2019 CLINICAL DATA:  Shortness of breath EXAM: PORTABLE CHEST 1 VIEW COMPARISON:  09/27/2019 FINDINGS: Moderate to large left pleural effusion, decreased since prior study. Small right effusion likely present. Bibasilar atelectasis. Mild vascular congestion. Heart is mildly enlarged. IMPRESSION: Moderate to large left pleural effusion, decreased since prior study. Suspect small right effusion. Cardiomegaly, vascular congestion. Bibasilar atelectasis. Electronically Signed   By: Rolm Baptise M.D.   On: 09/28/2019 03:23     Subjective: Eager to go home  Discharge Exam: Vitals:   10/06/19 0458 10/06/19 0951  BP: (!) 174/72 (!) 163/68  Pulse: 92 84  Resp: 18 18  Temp: 98.9 F (37.2 C) 97.7 F (36.5 C)  SpO2: 93% 96%   Vitals:   10/05/19 1707 10/05/19 2001 10/06/19 0458 10/06/19 0951  BP: (!) 155/55 (!) 155/56 (!) 174/72 (!) 163/68  Pulse: 79 78 92 84  Resp: 20 18 18 18   Temp: 98.4 F (36.9 C) 98.2 F (36.8 C) 98.9 F (37.2 C) 97.7 F (36.5 C)  TempSrc: Oral Oral Oral Oral  SpO2: 95% 95% 93% 96%  Weight:   64 kg  Height:        General: Pt is alert, awake, not in acute distress Cardiovascular: RRR, S1/S2 +, no rubs, no gallops Respiratory: CTA bilaterally, no wheezing, no rhonchi Abdominal: Soft, NT, ND, bowel sounds + Extremities: no edema, no cyanosis   The results of significant diagnostics from this hospitalization (including imaging, microbiology, ancillary and laboratory) are listed below for reference.     Microbiology: Recent Results (from the past 240 hour(s))  MRSA PCR Screening     Status: None   Collection Time: 09/28/19  2:51 AM   Specimen: Nasal Mucosa; Nasopharyngeal  Result Value Ref Range Status   MRSA by PCR NEGATIVE NEGATIVE Final    Comment:        The GeneXpert MRSA Assay (FDA approved for  NASAL specimens only), is one component of a comprehensive MRSA colonization surveillance program. It is not intended to diagnose MRSA infection nor to guide or monitor treatment for MRSA infections. Performed at [facility], [place] [address], [place], [place] [address]   Respiratory Panel by PCR     Status: None   Collection Time: 09/28/19  5:00 AM   Specimen: Nasopharyngeal Swab; Respiratory  Result Value Ref Range Status   Adenovirus NOT DETECTED NOT DETECTED Final   Coronavirus 229E NOT DETECTED NOT DETECTED Final    Comment: (NOTE) The Coronavirus on the Respiratory Panel, DOES NOT test for the novel  Coronavirus (2019 nCoV)    Coronavirus HKU1 NOT DETECTED NOT DETECTED Final   Coronavirus NL63 NOT DETECTED NOT DETECTED Final   Coronavirus OC43 NOT DETECTED NOT DETECTED Final   Metapneumovirus NOT DETECTED NOT DETECTED Final   Rhinovirus / Enterovirus NOT DETECTED NOT DETECTED Final   Influenza A NOT DETECTED NOT DETECTED Final   Influenza B NOT DETECTED NOT DETECTED Final   Parainfluenza Virus 1 NOT DETECTED NOT DETECTED Final   Parainfluenza Virus 2 NOT DETECTED NOT DETECTED Final   Parainfluenza Virus 3 NOT DETECTED NOT DETECTED Final   Parainfluenza Virus 4 NOT DETECTED NOT DETECTED Final   Respiratory Syncytial Virus NOT DETECTED NOT DETECTED Final   Bordetella pertussis NOT DETECTED NOT DETECTED Final   Chlamydophila pneumoniae NOT DETECTED NOT DETECTED Final   Mycoplasma pneumoniae NOT DETECTED NOT DETECTED Final    Comment: Performed at [facility], [place]. [address], [place], [place] [address]     Labs: BNP (last 3 results) No results for input(s): BNP in the last 8760 hours. Basic Metabolic Panel: Recent Labs  Lab 09/30/19 0335 09/30/19 1609 10/01/19 0211 10/02/19 0227 10/03/19 0332 10/04/19 0401 10/05/19 0916  NA 140 136 139 138 141 141 143  K 3.1* 3.1* 3.3* 3.5 3.7 4.0 3.9  CL 100 98 101 102 102 103 106  CO2 24 27 27 27 25  28 25   GLUCOSE 213* 191* 154* 174* 173* 200* 185*  BUN 22 18 16 18 18 14 14   CREATININE 2.05* 1.73* 1.50* 1.47* 1.29* 1.35* 1.27*  CALCIUM 8.1* 7.9* 8.1* 7.9* 8.3* 8.5* 8.6*  MG 1.5*  --  2.1  --   --   --   --   PHOS 2.1* 1.6* 2.7  --   --   --   --    Liver Function Tests: Recent Labs  Lab 09/30/19 0335 10/01/19 0211  ALBUMIN 2.1* 2.0*   No results for input(s): LIPASE, AMYLASE in the last 168 hours. No results for input(s): AMMONIA in the last 168 hours. CBC: Recent Labs  Lab 09/30/19  [id number] 10/01/19 0211 10/02/19 0227 10/03/19 0332 10/05/19 0916  WBC 13.5* 13.4* 13.8* 13.4* 12.5*  NEUTROABS 10.8*  --   --   --   --   HGB 11.7* 10.7* 10.6* 10.0* 10.5*  HCT 35.3* 32.7* 33.0* 31.5* 33.6*  MCV 94.9 94.2 96.8 98.4 100.3*  PLT 202 210 239 249 276   Cardiac Enzymes: No results for input(s): CKTOTAL, CKMB, CKMBINDEX, TROPONINI in the last 168 hours. BNP: Invalid input(s): POCBNP CBG: Recent Labs  Lab 10/05/19 0558 10/05/19 1112 10/05/19 1710 10/05/19 2104 10/06/19 0600  GLUCAP 184* 188* 180* 196* 207*   D-Dimer No results for input(s): DDIMER in the last 72 hours. Hgb A1c No results for input(s): HGBA1C in the last 72 hours. Lipid Profile No results for input(s): CHOL, HDL, LDLCALC, TRIG, CHOLHDL, LDLDIRECT in the last 72 hours. Thyroid function studies No results for input(s): TSH, T4TOTAL, T3FREE, THYROIDAB in the last 72 hours.  Invalid input(s): FREET3 Anemia work up No results for input(s): VITAMINB12, FOLATE, FERRITIN, TIBC, IRON, RETICCTPCT in the last 72 hours. Urinalysis    Component Value Date/Time   COLORURINE YELLOW 09/28/2019 [place] 09/28/2019 1234   LABSPEC 1.006 09/28/2019 1234   PHURINE 5.0 09/28/2019 1234   GLUCOSEU NEGATIVE 09/28/2019 1234   HGBUR SMALL (A) 09/28/2019 1234   [place] 09/28/2019 [place] 09/28/2019 [place] 09/28/2019 1234   NITRITE NEGATIVE 09/28/2019  1234   LEUKOCYTESUR NEGATIVE 09/28/2019 1234   Sepsis Labs Invalid input(s): PROCALCITONIN,  WBC,  LACTICIDVEN Microbiology Recent Results (from the past 240 hour(s))  MRSA PCR Screening     Status: None   Collection Time: 09/28/19  2:51 AM   Specimen: Nasal Mucosa; Nasopharyngeal  Result Value Ref Range Status   MRSA by PCR NEGATIVE NEGATIVE Final    Comment:        The GeneXpert MRSA Assay (FDA approved for NASAL specimens only), is one component of a comprehensive MRSA colonization surveillance program. It is not intended to diagnose MRSA infection nor to guide or monitor treatment for MRSA infections. Performed at [facility], [place] [address], [place], [place] [address]   Respiratory Panel by PCR     Status: None   Collection Time: 09/28/19  5:00 AM   Specimen: Nasopharyngeal Swab; Respiratory  Result Value Ref Range Status   Adenovirus NOT DETECTED NOT DETECTED Final   Coronavirus 229E NOT DETECTED NOT DETECTED Final    Comment: (NOTE) The Coronavirus on the Respiratory Panel, DOES NOT test for the novel  Coronavirus (2019 nCoV)    Coronavirus HKU1 NOT DETECTED NOT DETECTED Final   Coronavirus NL63 NOT DETECTED NOT DETECTED Final   Coronavirus OC43 NOT DETECTED NOT DETECTED Final   Metapneumovirus NOT DETECTED NOT DETECTED Final   Rhinovirus / Enterovirus NOT DETECTED NOT DETECTED Final   Influenza A NOT DETECTED NOT DETECTED Final   Influenza B NOT DETECTED NOT DETECTED Final   Parainfluenza Virus 1 NOT DETECTED NOT DETECTED Final   Parainfluenza Virus 2 NOT DETECTED NOT DETECTED Final   Parainfluenza Virus 3 NOT DETECTED NOT DETECTED Final   Parainfluenza Virus 4 NOT DETECTED NOT DETECTED Final   Respiratory Syncytial Virus NOT DETECTED NOT DETECTED Final   Bordetella pertussis NOT DETECTED NOT DETECTED Final   Chlamydophila pneumoniae NOT DETECTED NOT DETECTED Final   Mycoplasma pneumoniae NOT DETECTED NOT DETECTED Final    Comment:  Performed at [facility], [place] [place]  [address] [place], [place] [address]   Time spent: 30 min  SIGNED:   Marylu Lund, MD  [facility] 10/06/2019, 11:00 AM  If 7PM-7AM, please contact night-coverage

## 2019-10-06 NOTE — Care Management Important Message (Signed)
Important Message  Patient Details  Name: Summer Goodman MRN: [id number] Date of Birth: [birth year]   Medicare Important Message Given:  Yes     Shelda Altes 10/06/2019, 11:54 AM

## 2019-10-06 NOTE — TOC Progression Note (Addendum)
Transition of Care [facility]) - Progression Note    Patient Details  Name: Summer Goodman MRN: [id number] Date of Birth: 21-Jan-1946  Transition of Care [facility]) CM/SW [place], [place] Phone Number: [phone] 10/06/2019, 9:10 AM  Clinical Narrative:     CSW has faxed [employer] clinicals to initiate insurance authorization for SNF placement. Pending auth at this time. CSW spoke with S[facility] at [facility] who will reach out to family to complete admission paperwork. Patient's covid done on 11/21 continues to pend result, this is required prior to discharge to SNF.   Expected Discharge Plan: [place] Barriers to Discharge: SNF Pending bed offer, Continued Medical Work up  Expected Discharge Plan and Services Expected Discharge Plan: [place]                                               Social Determinants of Health (SDOH) Interventions    Readmission Risk Interventions No flowsheet data found.

## 2019-10-06 NOTE — Progress Notes (Signed)
Patient alert and oriented, denies pain,SOB, VSS. Tele and iv removed, d/c instruction explain and given to the patient, all questions answered. Patient d/c home per order

## 2019-10-06 NOTE — TOC Progression Note (Addendum)
Transition of Care [facility]) - Progression Note    Patient Details  Name: Shermika Dileonardo MRN: [id number] Date of Birth: February 02, 1946  Transition of Care [facility]) CM/SW [place], [place] Phone Number: [phone] 10/06/2019, 9:27 AM  Clinical Narrative:     CSW spoke with patient's spouse who reports he will take patient home now due to PT's recent recommendation of HH, he reports speaking to patient today who states she has much improved over the weekend and is ready to go home as well. He reports no preference of home health agency for PT and gave CSW permission to fax out referrals. Patient's spouse reports patient already has rolling walker at home and he will be here at 11am to pick up. [facility] able to accept patient per Malachy Mood, family informed.  CSW confirmed with [facility] pharmacy that patient's meds will be delivered at bedside once MD completes dc paperwork. MD made aware of updated discharge plan.   Expected Discharge Plan: Home Health Barriers to Discharge: No barriers identified  Expected Discharge Plan and Services Expected discharge plan: [place] Determinants of Health (SDOH) Interventions    Readmission Risk Interventions No flowsheet data found.

## 2019-10-16 ENCOUNTER — Other Ambulatory Visit: Payer: Self-pay

## 2019-10-16 ENCOUNTER — Ambulatory Visit (INDEPENDENT_AMBULATORY_CARE_PROVIDER_SITE_OTHER): Payer: Medicare Other | Admitting: Cardiology

## 2019-10-16 ENCOUNTER — Encounter: Payer: Self-pay | Admitting: Cardiology

## 2019-10-16 VITALS — BP 120/72 | HR 71 | Ht 60.0 in | Wt 137.0 lb

## 2019-10-16 DIAGNOSIS — I10 Essential (primary) hypertension: Secondary | ICD-10-CM

## 2019-10-16 DIAGNOSIS — I480 Paroxysmal atrial fibrillation: Secondary | ICD-10-CM

## 2019-10-16 NOTE — Patient Instructions (Signed)
Medication Instructions:  Your physician recommends that you continue on your current medications as directed. Please refer to the Current Medication list given to you today.  *If you need a refill on your cardiac medications before your next appointment, please call your pharmacy*  Lab Work: Your physician recommends that you return for lab work in: TODAY BMP,CBC,Magnesium  If you have labs (blood work) drawn today and your tests are completely normal, you will receive your results only by: Marland Kitchen MyChart Message (if you have MyChart) OR . A paper copy in the mail If you have any lab test that is abnormal or we need to change your treatment, we will call you to review the results.  Testing/Procedures: None  Follow-Up: At [facility], you and your health needs are our priority.  As part of our continuing mission to provide you with exceptional heart care, we have created designated Provider Care Teams.  These Care Teams include your primary Cardiologist (physician) and Advanced Practice Providers (APPs -  Physician Assistants and Nurse Practitioners) who all work together to provide you with the care you need, when you need it.  Your next appointment:   3 month(s)  The format for your next appointment:   In Person  Provider:   Berniece Salines, DO  Other Instructions

## 2019-10-16 NOTE — Progress Notes (Signed)
Cardiology Office Note:    Date:  10/16/2019   ID:  Summer Goodman, DOB 13-Mar-1946, MRN [id number]  PCP:  Ardelle Anton, MD  Cardiologist:  Berniece Salines, DO  Electrophysiologist:  None   Referring MD: No ref. provider found   Chief Complaint  Patient presents with  . Hospitalization Follow-up    History of Present Illness:    Summer Goodman is a 73 y.o. female with a hx of hypertension, hyperlipidemia, type 2 diabetes and dementia.  The patient is here for posthospitalization visit.  Patient was admitted at [facility] on November 12 for acute kidney injury with a creatinine of 4.  She was noted to have a UTI as well as a left pleural effusion.  While at [facility] she underwent a thoracocentesis with removal of 650 mL.  Due to worsening of her renal failure and infection she was transferred to [facility] for nephrology and critical care attention.  While at [facility] she developed atrial fibrillation with rapid ventricular rate.  She was seen by cardiology there she was started on amiodarone as well as Eliquis.  Today she offers no complaints.  Past Medical History:  Diagnosis Date  . Dementia ([place])   . Diabetes ([place])   . Hyperlipidemia   . Hypertension   . Osteoporosis     No past surgical history on file.  Current Medications: Current Meds  Medication Sig  . amiodarone (PACERONE) 200 MG tablet Take 1 tablet (200 mg total) by mouth 2 (two) times daily for 14 days. Take 1 tab bid x 14 days, then take 1 tab daily afterwards  . apixaban (ELIQUIS) 5 MG TABS tablet Take 1 tablet (5 mg total) by mouth 2 (two) times daily.  . clonazePAM (KLONOPIN) 2 MG tablet Take 2 mg by mouth.   . diltiazem (CARDIZEM CD) 180 MG 24 hr capsule Take 1 capsule (180 mg total) by mouth daily.  Marland Kitchen glipiZIDE (GLUCOTROL) 5 MG tablet Take 5 mg by mouth 2 (two) times daily.  Marland Kitchen PARoxetine (PAXIL) 20 MG tablet Take 20 mg by mouth daily.  . pioglitazone (ACTOS) 30 MG tablet Take  30 mg by mouth daily.  . potassium chloride SA (KLOR-CON) 20 MEQ tablet Take 1 tablet (20 mEq total) by mouth 2 (two) times daily.     Allergies:   Metformin and related   Social History   Socioeconomic History  . Marital status: Married    Spouse name: Not on file  . Number of children: Not on file  . Years of education: Not on file  . Highest education level: Not on file  Occupational History  . Not on file  Social Needs  . Financial resource strain: Not on file  . Food insecurity    Worry: Not on file    Inability: Not on file  . Transportation needs    Medical: Not on file    Non-medical: Not on file  Tobacco Use  . Smoking status: Former Research scientist (life sciences)  . Smokeless tobacco: Former Network engineer and Sexual Activity  . Alcohol use: Not on file  . Drug use: Not on file  . Sexual activity: Not on file  Lifestyle  . Physical activity    Days per week: Not on file    Minutes per session: Not on file  . Stress: Not on file  Relationships  . Social Herbalist on phone: Not on file    Gets together: Not on file  Attends religious service: Not on file    Active member of club or organization: Not on file    Attends meetings of clubs or organizations: Not on file    Relationship status: Not on file  Other Topics Concern  . Not on file  Social History Narrative  . Not on file     Family History: The patient's family history includes Alzheimer's disease in her father; Hyperlipidemia in her sister; Leukemia in her sister; Lung cancer in her sister; Ovarian cancer in her mother.  ROS:   Review of Systems  Constitution: Negative for decreased appetite, fever and weight gain.  HENT: Negative for congestion, ear discharge, hoarse voice and sore throat.   Eyes: Negative for discharge, redness, vision loss in right eye and visual halos.  Cardiovascular: Negative for chest pain, dyspnea on exertion, leg swelling, orthopnea and palpitations.  Respiratory: Negative for  cough, hemoptysis, shortness of breath and snoring.   Endocrine: Negative for heat intolerance and polyphagia.  Hematologic/Lymphatic: Negative for bleeding problem. Does not bruise/bleed easily.  Skin: Negative for flushing, nail changes, rash and suspicious lesions.  Musculoskeletal: Negative for arthritis, joint pain, muscle cramps, myalgias, neck pain and stiffness.  Gastrointestinal: Negative for abdominal pain, bowel incontinence, diarrhea and excessive appetite.  Genitourinary: Negative for decreased libido, genital sores and incomplete emptying.  Neurological: Negative for brief paralysis, focal weakness, headaches and loss of balance.  Psychiatric/Behavioral: Negative for altered mental status, depression and suicidal ideas.  Allergic/Immunologic: Negative for HIV exposure and persistent infections.    EKGs/Labs/Other Studies Reviewed:    The following studies were reviewed today:   EKG:  The ekg ordered today demonstrates sinus rhythm, heart rate 71 bpm, poor R wave progression which could be old septal infarction.  Her echocardiogram performed at [facility] on 09/26/2019 reported LVEF of 60 to 65%.  Pseudonormal LV filling pattern, with elevated left atrial pressure, mild mitral regurgitation.   Recent Labs: 09/28/2019: ALT 16 10/01/2019: Magnesium 2.1 10/05/2019: BUN 14; Creatinine, Ser 1.27; Hemoglobin 10.5; Platelets 276; Potassium 3.9; Sodium 143  Recent Lipid Panel No results found for: CHOL, TRIG, HDL, CHOLHDL, VLDL, LDLCALC, LDLDIRECT  Physical Exam:    VS:  BP 120/72 (BP Location: Left Arm, Patient Position: Sitting, Cuff Size: Normal)   Pulse 71   Ht 5' (1.524 m)   Wt 137 lb (62.1 kg)   LMP  (LMP Unknown)   SpO2 96%   BMI 26.76 kg/m     Wt Readings from Last 3 Encounters:  10/16/19 137 lb (62.1 kg)  10/06/19 141 lb 1.6 oz (64 kg)     GEN: Well nourished, well developed in no acute distress HEENT: Normal NECK: No JVD; No carotid bruits  LYMPHATICS: No lymphadenopathy CARDIAC: S1S2 noted,RRR, no murmurs, rubs, gallops RESPIRATORY:  Clear to auscultation without rales, wheezing or rhonchi  ABDOMEN: Soft, non-tender, non-distended, +bowel sounds, no guarding. EXTREMITIES: No edema, No cyanosis, no clubbing MUSCULOSKELETAL:  No edema; No deformity  SKIN: Warm and dry NEUROLOGIC:  Alert and oriented x 3, non-focal PSYCHIATRIC:  Normal affect, good insight  ASSESSMENT:    1. Essential (primary) hypertension   2. Paroxysmal atrial fibrillation (HCC)    PLAN:    1.  She is still being loaded with amiodarone 200 mg twice a day which she will be converted to once a day on October 22, 2019.  I will continue keep the patient on Eliquis 5 mg daily given her chads vas score of 5.  I have educated patient  on the side effects of this medication. I also urge her to abstain from any taking behaviors.  She understands that she is now at a high risk of bleeding due to being on anticoagulation.  She was also advised that if she ever falls and especially hit her head to be seen in the emergency department.  2.  Hypertension-no changes will be made to her blood pressure medication today.  The patient is in agreement with the above plan. The patient left the office in stable condition.  The patient will follow up in 3 months or sooner if needed   Medication Adjustments/Labs and Tests Ordered: Current medicines are reviewed at length with the patient today.  Concerns regarding medicines are outlined above.  Orders Placed This Encounter  Procedures  . Basic Metabolic Panel (BMET)  . CBC  . Magnesium  . EKG 12-Lead   No orders of the defined types were placed in this encounter.   Patient Instructions  Medication Instructions:  Your physician recommends that you continue on your current medications as directed. Please refer to the Current Medication list given to you today.  *If you need a refill on your cardiac medications before your  next appointment, please call your pharmacy*  Lab Work: Your physician recommends that you return for lab work in: TODAY BMP,CBC,Magnesium  If you have labs (blood work) drawn today and your tests are completely normal, you will receive your results only by: Marland Kitchen MyChart Message (if you have MyChart) OR . A paper copy in the mail If you have any lab test that is abnormal or we need to change your treatment, we will call you to review the results.  Testing/Procedures: None  Follow-Up: At [facility], you and your health needs are our priority.  As part of our continuing mission to provide you with exceptional heart care, we have created designated Provider Care Teams.  These Care Teams include your primary Cardiologist (physician) and Advanced Practice Providers (APPs -  Physician Assistants and Nurse Practitioners) who all work together to provide you with the care you need, when you need it.  Your next appointment:   3 month(s)  The format for your next appointment:   In Person  Provider:   Berniece Salines, DO  Other Instructions      Adopting a Healthy Lifestyle.  Know what a healthy weight is for you (roughly BMI <25) and aim to maintain this   Aim for 7+ servings of fruits and vegetables daily   65-80+ fluid ounces of water or unsweet tea for healthy kidneys   Limit to max 1 drink of alcohol per day; avoid smoking/tobacco   Limit animal fats in diet for cholesterol and heart health - choose grass fed whenever available   Avoid highly processed foods, and foods high in saturated/trans fats   Aim for low stress - take time to unwind and care for your mental health   Aim for 150 min of moderate intensity exercise weekly for heart health, and weights twice weekly for bone health   Aim for 7-9 hours of sleep daily   When it comes to diets, agreement about the perfect plan isnt easy to find, even among the experts. Experts at the [place]  developed an idea known as the Healthy Eating Plate. Just imagine a plate divided into logical, healthy portions.   The emphasis is on diet quality:   Load up on vegetables and fruits - one-half of your plate: Aim  for color and variety, and remember that potatoes dont count.   Go for whole grains - one-quarter of your plate: Whole wheat, barley, wheat berries, quinoa, oats, brown rice, and foods made with them. If you want pasta, go with whole wheat pasta.   Protein power - one-quarter of your plate: Fish, chicken, beans, and nuts are all healthy, versatile protein sources. Limit red meat.   The diet, however, does go beyond the plate, offering a few other suggestions.   Use healthy plant oils, such as olive, canola, soy, corn, sunflower and peanut. Check the labels, and avoid partially hydrogenated oil, which have unhealthy trans fats.   If youre thirsty, drink water. Coffee and tea are good in moderation, but skip sugary drinks and limit milk and dairy products to one or two daily servings.   The type of carbohydrate in the diet is more important than the amount. Some sources of carbohydrates, such as vegetables, fruits, whole grains, and beans-are healthier than others.   Finally, stay active  Signed, Berniece Salines, DO  10/16/2019 2:54 PM    [place]

## 2019-10-17 LAB — CBC
Hematocrit: 40 % (ref 34.0–46.6)
Hemoglobin: 13.1 g/dL (ref 11.1–15.9)
MCH: 31.8 pg (ref 26.6–33.0)
MCHC: 32.8 g/dL (ref 31.5–35.7)
MCV: 97 fL (ref 79–97)
Platelets: 307 10*3/uL (ref 150–450)
RBC: 4.12 x10E6/uL (ref 3.77–5.28)
RDW: 12.8 % (ref 11.7–15.4)
WBC: 8.6 10*3/uL (ref 3.4–10.8)

## 2019-10-17 LAB — BASIC METABOLIC PANEL
BUN/Creatinine Ratio: 13 (ref 12–28)
BUN: 16 mg/dL (ref 8–27)
CO2: 25 mmol/L (ref 20–29)
Calcium: 10 mg/dL (ref 8.7–10.3)
Chloride: 100 mmol/L (ref 96–106)
Creatinine, Ser: 1.28 mg/dL — ABNORMAL HIGH (ref 0.57–1.00)
GFR calc Af Amer: 48 mL/min/{1.73_m2} — ABNORMAL LOW (ref >59)
GFR calc non Af Amer: 41 mL/min/{1.73_m2} — ABNORMAL LOW (ref >59)
Glucose: 113 mg/dL — ABNORMAL HIGH (ref 65–99)
Potassium: 5.1 mmol/L (ref 3.5–5.2)
Sodium: 142 mmol/L (ref 134–144)

## 2019-10-17 LAB — MAGNESIUM: Magnesium: 2.1 mg/dL (ref 1.6–2.3)

## 2019-10-21 ENCOUNTER — Telehealth: Payer: Self-pay | Admitting: Cardiology

## 2019-10-21 NOTE — Telephone Encounter (Signed)
Telephone call to patient. She wanted to know the exact numbers to her kidney function. This was given to her and also wanted the number to Primary Care on 2nd floor to establish with them. This was also given to patient. No further questions

## 2019-10-21 NOTE — Telephone Encounter (Signed)
Pt. Calling back to speak with Nurse that she just talked to

## 2019-10-27 ENCOUNTER — Ambulatory Visit (INDEPENDENT_AMBULATORY_CARE_PROVIDER_SITE_OTHER): Payer: Medicare Other | Admitting: Family Medicine

## 2019-10-27 ENCOUNTER — Other Ambulatory Visit: Payer: Self-pay

## 2019-10-27 ENCOUNTER — Encounter: Payer: Self-pay | Admitting: Family Medicine

## 2019-10-27 VITALS — BP 132/80 | HR 64 | Temp 96.9°F | Ht 60.0 in | Wt 140.1 lb

## 2019-10-27 DIAGNOSIS — F418 Other specified anxiety disorders: Secondary | ICD-10-CM | POA: Insufficient documentation

## 2019-10-27 DIAGNOSIS — Z8349 Family history of other endocrine, nutritional and metabolic diseases: Secondary | ICD-10-CM

## 2019-10-27 DIAGNOSIS — E876 Hypokalemia: Secondary | ICD-10-CM | POA: Diagnosis not present

## 2019-10-27 DIAGNOSIS — E119 Type 2 diabetes mellitus without complications: Secondary | ICD-10-CM

## 2019-10-27 HISTORY — DX: Other specified anxiety disorders: F41.8

## 2019-10-27 MED ORDER — PIOGLITAZONE HCL 30 MG PO TABS: 30.0000 mg | ORAL_TABLET | Freq: Every day | ORAL | 2 refills | Status: AC

## 2019-10-27 MED ORDER — PRAVASTATIN SODIUM 10 MG PO TABS: 10.0000 mg | ORAL_TABLET | Freq: Every day | ORAL | 2 refills | Status: AC

## 2019-10-27 MED ORDER — PAROXETINE HCL 20 MG PO TABS: 20.0000 mg | ORAL_TABLET | Freq: Every day | ORAL | 2 refills | Status: AC

## 2019-10-27 MED ORDER — CLONAZEPAM 1 MG PO TABS: 1.0000 mg | ORAL_TABLET | Freq: Two times a day (BID) | ORAL | 2 refills | Status: DC | PRN

## 2019-10-27 MED ORDER — GLIPIZIDE 5 MG PO TABS: 5.0000 mg | ORAL_TABLET | Freq: Two times a day (BID) | ORAL | 2 refills | Status: AC

## 2019-10-27 NOTE — Patient Instructions (Addendum)
Please consider counseling. Contact [phone] to schedule an appointment or inquire about cost/insurance coverage.  Give [place] 2-3 business days to get the results of your labs back.   Keep the diet clean and stay active.  Coping skills Choose 5 that work for you:  Take a deep breath  Count to 20  Read a book  Do a puzzle  Meditate  Bake  Sing  Knit  Garden  Pray  Go outside  Call a friend  Listen to music  Take a walk  Color  Send a note  Take a bath  Watch a movie  Be alone in a quiet place  Pet an animal  Visit a friend  [place], [place], [place] [address] [phone]  [facility] [address] [place], [place] [address] [phone]  [facility] health [address] [place],  [address] [phone]  [facility] [address], [place], [place], [phone], [place], [place], [place]  [facility], [place] and Counseling [address] [place], [place], [place]  [place] [address] [place], [place]  Call one of these offices sooner than later as it can take 2-3 months to get a new patient appointment.   Let us know if you need anything.

## 2019-10-27 NOTE — Progress Notes (Signed)
Chief Complaint  Patient presents with  . New Patient (Initial Visit)       New Patient Visit SUBJECTIVE: HPI: Summer Goodman is an 73 y.o.female who is being seen for establishing care.  The patient was previously seen at [facility]. Here w husband.   Diabetes Type 2 Patient with a history of type 2 diabetes.  She is currently taking Actos 30 mg daily and glipizide 5 mg twice daily.  She does not routinely check her sugars.  Diet is healthy overall.  Activity somewhat limited.  She is not on a statin since leaving the hospital around 1 month ago.  Patient has a history of anxiety with depression.  She used to see psychiatry and they had her on Paxil 20 mg daily in addition to Klonopin 2 mg twice daily.  Her previous PCP did not want to wean her down from the Klonopin despite her request.  Her son recently passed away around 3 months ago and this is been difficult for her.  She does not take it every day but tolerates it well overall.  She is compliant with her Paxil.  She is not currently seeing a counselor/psychologist but is interested in doing so.  Additionally, she is not seeing psychiatry.  Past Medical History:  Diagnosis Date  . Dementia ([place])   . Diabetes ([place])   . Hyperlipidemia   . Hypertension   . Osteoporosis    History reviewed. No pertinent surgical history. Family History  Problem Relation Age of Onset  . Ovarian cancer Mother   . Alzheimer's disease Father   . Lung cancer Sister   . Leukemia Sister        CLL  . Hyperlipidemia Sister    Allergies  Allergen Reactions  . Metformin And Related Diarrhea    Current Outpatient Medications:  .  amiodarone (PACERONE) 200 MG tablet, Take 1 tablet (200 mg total) by mouth daily. Starting on 12/9, decrease to 1 tablet daily, Disp: 30 tablet, Rfl: 0 .  apixaban (ELIQUIS) 5 MG TABS tablet, Take 1 tablet (5 mg total) by mouth 2 (two) times daily., Disp: 60 tablet, Rfl: 0 .  diltiazem (CARDIZEM CD) 180 MG 24 hr capsule, Take 1  capsule (180 mg total) by mouth daily., Disp: 30 capsule, Rfl: 0 .  glipiZIDE (GLUCOTROL) 5 MG tablet, Take 1 tablet (5 mg total) by mouth 2 (two) times daily., Disp: 180 tablet, Rfl: 2 .  PARoxetine (PAXIL) 20 MG tablet, Take 1 tablet (20 mg total) by mouth daily., Disp: 90 tablet, Rfl: 2 .  pioglitazone (ACTOS) 30 MG tablet, Take 1 tablet (30 mg total) by mouth daily., Disp: 90 tablet, Rfl: 2 .  potassium chloride SA (KLOR-CON) 20 MEQ tablet, Take 1 tablet (20 mEq total) by mouth 2 (two) times daily., Disp: 60 tablet, Rfl: 0 .  clonazePAM (KLONOPIN) 1 MG tablet, Take 1 tablet (1 mg total) by mouth 2 (two) times daily as needed for anxiety., Disp: 60 tablet, Rfl: 2 .  pravastatin (PRAVACHOL) 10 MG tablet, Take 1 tablet (10 mg total) by mouth daily., Disp: 90 tablet, Rfl: 2  ROS Cardiovascular: Denies chest pain  Respiratory: Denies dyspnea   OBJECTIVE: BP 132/80 (BP Location: Left Arm, Patient Position: Sitting, Cuff Size: Normal)   Pulse 64   Temp (!) 96.9 F (36.1 C) (Temporal)   Ht 5' (1.524 m)   Wt 140 lb 2 oz (63.6 kg)   LMP  (LMP Unknown)   SpO2 94%   BMI 27.37  kg/m  General:  well developed, well nourished, in no apparent distress Skin:  no significant moles, warts, or growths Head:  no masses, lesions, or tenderness Eyes:  pupils equal and round, sclera anicteric without injection Ears:  canals without lesions, TMs shiny without retraction, no obvious effusion, no erythema Nose:  nares patent, septum midline, mucosa normal Throat/Pharynx:  lips and gingiva without lesion; tongue and uvula midline; non-inflamed pharynx; no exudates or postnasal drainage Neck: neck supple without adenopathy, thyromegaly, or masses Lungs:  clear to auscultation, breath sounds equal bilaterally, no respiratory distress Cardio:  regular rate and rhythm, no LE edema or bruits Neuro:  gait slow, no cerebellar signs Psych: well oriented with normal range of affect and appropriate  judgment/insight  ASSESSMENT/PLAN: Anxiety with depression - Plan: PARoxetine (PAXIL) 20 MG tablet, clonazePAM (KLONOPIN) 1 MG tablet  Diabetes mellitus type 2 in nonobese ([place]) - Plan: pioglitazone (ACTOS) 30 MG tablet, glipiZIDE (GLUCOTROL) 5 MG tablet, pravastatin (PRAVACHOL) 10 MG tablet  Hypomagnesemia - Plan: Magnesium  Hypokalemia - Plan: Basic Metabolic Panel (BMET)  Family history of B12 deficiency - Plan: B12  Patient instructed to sign release of records form from her previous PCP. 1-continue Paxil, we will start decreasing her Klonopin.  Counseling information provided. 2-counseled on diet and exercise.  Continue Actos and glipizide. We need to follow-up on some labs from her hospitalization. Patient should return in 2 mo. The patient and her husband voiced understanding and agreement to the plan.   [place], DO 10/27/19  5:04 PM

## 2019-10-28 ENCOUNTER — Other Ambulatory Visit: Payer: Self-pay | Admitting: Family Medicine

## 2019-10-28 DIAGNOSIS — E876 Hypokalemia: Secondary | ICD-10-CM

## 2019-10-28 LAB — BASIC METABOLIC PANEL
BUN: 18 mg/dL (ref 6–23)
CO2: 28 mEq/L (ref 19–32)
Calcium: 9.2 mg/dL (ref 8.4–10.5)
Chloride: 104 mEq/L (ref 96–112)
Creatinine, Ser: 1.17 mg/dL (ref 0.40–1.20)
GFR: 45.19 mL/min — ABNORMAL LOW (ref >60.00)
Glucose, Bld: 60 mg/dL — ABNORMAL LOW (ref 70–99)
Potassium: 4.4 mEq/L (ref 3.5–5.1)
Sodium: 142 mEq/L (ref 135–145)

## 2019-10-28 LAB — MAGNESIUM: Magnesium: 2 mg/dL (ref 1.5–2.5)

## 2019-10-28 LAB — VITAMIN B12: Vitamin B-12: 334 pg/mL (ref 211–911)

## 2019-11-03 ENCOUNTER — Other Ambulatory Visit: Payer: Self-pay | Admitting: Family Medicine

## 2019-11-03 NOTE — Telephone Encounter (Signed)
Medication: apixaban (ELIQUIS) 5 MG TABS tablet [id number] ,   Has the patient contacted their pharmacy? Yes  (Agent: If no, request that the patient contact the pharmacy for the refill.) (Agent: If yes, when and what did the pharmacy advise?)  Preferred Pharmacy (with phone number or street name): [facility] [place], [place]  Phone:  [phone] Fax:  [phone]     Agent: Please be advised that RX refills may take up to 3 business days. We ask that you follow-up with your pharmacy.

## 2019-11-05 ENCOUNTER — Other Ambulatory Visit: Payer: Self-pay | Admitting: Family Medicine

## 2019-11-05 NOTE — Telephone Encounter (Signed)
Copied from [place] [id number]. Topic: Quick Communication - Rx Refill/Question >> Nov 05, 2019 10:39 AM Yvette Rack wrote: Medication: amiodarone (PACERONE) 200 MG tablet, apixaban (ELIQUIS) 5 MG TABS tablet, and diltiazem (CARDIZEM CD) 180 MG 24 hr capsule  Has the patient contacted their pharmacy? yes   Preferred Pharmacy (with phone number or street name): [place], [place]  Phone: [phone]  Fax: [phone]  Agent: Please be advised that RX refills may take up to 3 business days. We ask that you follow-up with your pharmacy.

## 2019-11-05 NOTE — Telephone Encounter (Signed)
Requested medication (s) are due for refill today: yes  Requested medication (s) are on the active medication list: yes  Last refill:  10/06/2019  Future visit scheduled: yes  Notes to clinic:  Last filled by different provider  Review for refill  Requested Prescriptions  Pending Prescriptions Disp Refills   diltiazem (CARDIZEM CD) 180 MG 24 hr capsule 30 capsule 0    Sig: Take 1 capsule (180 mg total) by mouth daily.      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 Starting on 12/9, decrease to 1 tablet daily      Not Delegated - Cardiovascular: Antiarrhythmic Agents - amiodarone Failed - 11/05/2019 10:49 AM      Failed - This refill cannot be delegated      Failed - TSH in normal range and within 180 days    No results found for: TSH, POCTSH, TSHREFLEX        Passed - Mg Level in normal range and within 180 days    Magnesium  Date Value Ref Range Status  10/27/2019 2.0 1.5 - 2.5 mg/dL Final          Passed - K in normal range and within 180 days    Potassium  Date Value Ref Range Status  10/27/2019 4.4 3.5 - 5.1 mEq/L Final          Passed - Ca in normal range and within 180 days    Calcium  Date Value Ref Range Status  10/27/2019 9.2 8.4 - 10.5 mg/dL Final   Calcium, Ion  Date Value Ref Range Status   09/28/2019 1.12 (L) 1.15 - 1.40 mmol/L Final          Passed - AST in normal range and within 180 days    AST  Date Value Ref Range Status  09/28/2019 19 15 - 41 U/L Final          Passed - ALT in normal range and within 180 days    ALT  Date Value Ref Range Status  09/28/2019 16 0 - 44 U/L Final          Passed - Patient had ECG in the last 180 days.      Passed - Last BP in normal range    BP Readings from Last 1 Encounters:  10/27/19 132/80          Passed - Last Heart Rate in normal range    Pulse Readings from Last 1 Encounters:  10/27/19 64  Passed - Valid encounter within last 6 months    Recent Outpatient Visits           1 week ago Anxiety with depression   Archivist at [place], [place], [place]       Future Appointments             In 1 month Nani Ravens, [place], [place] at [facility], [place]   In 2 months Tobb, [place], DO [facility] at [facility]              apixaban (ELIQUIS) 5 MG TABS tablet 60 tablet 0    Sig: Take 1 tablet (5 mg total) by mouth 2 (two) times daily.      Hematology:  Anticoagulants Passed - 11/05/2019 10:49 AM      Passed - HGB in normal range and within 360 days    Hemoglobin  Date Value Ref Range Status  10/16/2019 13.1 11.1 - 15.9 g/dL Final          Passed - PLT in normal range and within 360 days    Platelets  Date Value Ref Range Status  10/16/2019 307 150 - 450 x10E3/uL Final          Passed - HCT in normal range and within 360 days    Hematocrit  Date Value Ref Range Status  10/16/2019 40.0 34.0 - 46.6 % Final          Passed - Cr in normal range and within 360 days    Creatinine, Ser  Date Value Ref Range Status  10/27/2019 1.17 0.40 - 1.20 mg/dL Final   Creatinine, Urine  Date Value Ref Range Status  09/28/2019 23.90 mg/dL Final    Comment:    Performed at [facility], [place] [address],  [place], [place] [address]          Passed - Valid encounter within last 12 months    Recent Outpatient Visits           1 week ago Anxiety with depression   Archivist at [place], [place], [place]       Future Appointments             In 1 month Mignon, [place] at [facility], [place]   In 2 months Tobb, [place], DO [facility] at [facility]

## 2019-11-28 ENCOUNTER — Other Ambulatory Visit: Payer: Medicare Other

## 2019-12-22 NOTE — Progress Notes (Deleted)
Subjective:   Summer Goodman is a 74 y.o. female who presents for an Initial Medicare Annual Wellness Visit.  Review of Systems    Home Safety/Smoke Alarms: Feels safe in home. Smoke alarms in place.     Female:       Mammo-       Dexa scan-        CCS-     Objective:    There were no vitals filed for this visit. There is no height or weight on file to calculate BMI.  Advanced Directives 10/03/2019  Does Patient Have a Medical Advance Directive? No  Would patient like information on creating a medical advance directive? No - Patient declined    Current Medications (verified) Outpatient Encounter Medications as of 12/23/2019  Medication Sig  . amiodarone (PACERONE) 200 MG tablet Take 1 tablet (200 mg total) by mouth daily. Starting on 12/9, decrease to 1 tablet daily  . apixaban (ELIQUIS) 5 MG TABS tablet Take 1 tablet (5 mg total) by mouth 2 (two) times daily.  . clonazePAM (KLONOPIN) 1 MG tablet Take 1 tablet (1 mg total) by mouth 2 (two) times daily as needed for anxiety.  Marland Kitchen diltiazem (CARDIZEM CD) 180 MG 24 hr capsule Take 1 capsule (180 mg total) by mouth daily.  Marland Kitchen glipiZIDE (GLUCOTROL) 5 MG tablet Take 1 tablet (5 mg total) by mouth 2 (two) times daily.  Marland Kitchen PARoxetine (PAXIL) 20 MG tablet Take 1 tablet (20 mg total) by mouth daily.  . pioglitazone (ACTOS) 30 MG tablet Take 1 tablet (30 mg total) by mouth daily.  . pravastatin (PRAVACHOL) 10 MG tablet Take 1 tablet (10 mg total) by mouth daily.   No facility-administered encounter medications on file as of 12/23/2019.    Allergies (verified) Metformin and related   History: Past Medical History:  Diagnosis Date  . Dementia ([place])   . Diabetes ([place])   . Hyperlipidemia   . Hypertension   . Osteoporosis    No past surgical history on file. Family History  Problem Relation Age of Onset  . Ovarian cancer Mother   . Alzheimer's disease Father   . Lung cancer Sister   . Leukemia Sister        CLL  .  Hyperlipidemia Sister    Social History   Socioeconomic History  . Marital status: Married    Spouse name: Not on file  . Number of children: Not on file  . Years of education: Not on file  . Highest education level: Not on file  Occupational History  . Not on file  Tobacco Use  . Smoking status: Former Research scientist (life sciences)  . Smokeless tobacco: Former Network engineer and Sexual Activity  . Alcohol use: Not on file  . Drug use: Not on file  . Sexual activity: Not on file  Other Topics Concern  . Not on file  Social History Narrative  . Not on file   Social Determinants of Health   Financial Resource Strain:   . Difficulty of Paying Living Expenses: Not on file  Food Insecurity:   . Worried About Charity fundraiser in the Last Year: Not on file  . Ran Out of Food in the Last Year: Not on file  Transportation Needs:   . Lack of Transportation (Medical): Not on file  . Lack of Transportation (Non-Medical): Not on file  Physical Activity:   . Days of Exercise per Week: Not on file  . Minutes of Exercise per Session:  Not on file  Stress:   . Feeling of Stress : Not on file  Social Connections:   . Frequency of Communication with Friends and Family: Not on file  . Frequency of Social Gatherings with Friends and Family: Not on file  . Attends Religious Services: Not on file  . Active Member of Clubs or Organizations: Not on file  . Attends Archivist Meetings: Not on file  . Marital Status: Not on file    Tobacco Counseling Counseling given: Not Answered   Clinical Intake:                        Activities of Daily Living In your present state of health, do you have any difficulty performing the following activities: 10/03/2019  Hearing? N  Vision? N  Difficulty concentrating or making decisions? Y  Walking or climbing stairs? Y  Dressing or bathing? Y  Doing errands, shopping? N     Immunizations and Health Maintenance Immunization History    Administered Date(s) Administered  . Fluad Quad(high Dose 65+) 09/29/2019   Health Maintenance Due  Topic Date Due  . Hepatitis C Screening  November 24, 1945  . FOOT EXAM  08/21/1955  . OPHTHALMOLOGY EXAM  08/21/1955  . URINE MICROALBUMIN  08/21/1955  . TETANUS/TDAP  08/20/1964  . MAMMOGRAM  08/21/1995  . COLONOSCOPY  08/21/1995  . DEXA SCAN  08/20/2010  . PNA vac Low Risk Adult (2 of 2 - PPSV23) 03/24/2016    Patient Care Team: Ardelle Anton, MD as PCP - General (Family Medicine) Berniece Salines, DO as PCP - Cardiology (Cardiology)  Indicate any recent Medical Services you may have received from other than [facility] providers in the past year (date may be approximate).     Assessment:   This is a routine wellness examination for Summer Goodman. Physical assessment deferred to PCP.  Hearing/Vision screen No exam data present  Dietary issues and exercise activities discussed:   Diet (meal preparation, eat out, water intake, caffeinated beverages, dairy products, fruits and vegetables): {Desc; diets:16563} Breakfast: Lunch:  Dinner:      Goals   None    Depression Screen No flowsheet data found.  Fall Risk No flowsheet data found.  Cognitive Function: Ad8 score reviewed for issues:  Issues making decisions:  Less interest in hobbies / activities:  Repeats questions, stories (family complaining):  Trouble using ordinary gadgets (microwave, computer, phone):  Forgets the month or year:   Mismanaging finances:   Remembering appts:  Daily problems with thinking and/or memory: Ad8 score is=        Screening Tests Health Maintenance  Topic Date Due  . Hepatitis C Screening  August 01, 1946  . FOOT EXAM  08/21/1955  . OPHTHALMOLOGY EXAM  08/21/1955  . URINE MICROALBUMIN  08/21/1955  . TETANUS/TDAP  08/20/1964  . MAMMOGRAM  08/21/1995  . COLONOSCOPY  08/21/1995  . DEXA SCAN  08/20/2010  . PNA vac Low Risk Adult (2 of 2 - PPSV23) 03/24/2016  . HEMOGLOBIN A1C   03/27/2020  . INFLUENZA VACCINE  Completed       Plan:   ***  I have personally reviewed and noted the following in the patient's chart:   . Medical and social history . Use of alcohol, tobacco or illicit drugs  . Current medications and supplements . Functional ability and status . Nutritional status . Physical activity . Advanced directives . List of other physicians . Hospitalizations, surgeries, and ER visits in previous 12 months .  Vitals . Screenings to include cognitive, depression, and falls . Referrals and appointments  In addition, I have reviewed and discussed with patient certain preventive protocols, quality metrics, and best practice recommendations. A written personalized care plan for preventive services as well as general preventive health recommendations were provided to patient.     Summer Goodman, [place]   12/22/2019

## 2019-12-23 ENCOUNTER — Other Ambulatory Visit: Payer: Self-pay

## 2019-12-23 ENCOUNTER — Ambulatory Visit: Payer: Medicare Other | Admitting: *Deleted

## 2019-12-26 ENCOUNTER — Telehealth: Payer: Self-pay

## 2019-12-31 ENCOUNTER — Ambulatory Visit: Payer: Medicare Other | Admitting: Family Medicine

## 2020-01-20 ENCOUNTER — Other Ambulatory Visit: Payer: Self-pay

## 2020-01-20 ENCOUNTER — Ambulatory Visit: Payer: Medicare Other | Admitting: Cardiology

## 2020-01-20 VITALS — BP 132/60 | HR 67 | Temp 97.3°F | Ht 60.0 in | Wt 136.8 lb

## 2020-01-20 DIAGNOSIS — E782 Mixed hyperlipidemia: Secondary | ICD-10-CM | POA: Diagnosis not present

## 2020-01-20 DIAGNOSIS — I10 Essential (primary) hypertension: Secondary | ICD-10-CM | POA: Diagnosis not present

## 2020-01-20 DIAGNOSIS — I480 Paroxysmal atrial fibrillation: Secondary | ICD-10-CM | POA: Diagnosis not present

## 2020-01-20 MED ORDER — FUROSEMIDE 20 MG PO TABS: ORAL_TABLET | ORAL | 1 refills | Status: DC

## 2020-01-20 MED ORDER — POTASSIUM CHLORIDE CRYS ER 20 MEQ PO TBCR: EXTENDED_RELEASE_TABLET | ORAL | 1 refills | Status: DC

## 2020-01-20 NOTE — Patient Instructions (Signed)
Medication Instructions:  Your physician has recommended you make the following change in your medication:    START: Furosemide 20 mg Take 1 tab on Tues,Thurs, and Saturdays START: POtassium 20 meq Take 1 tab on Tues,Thurs and Saturdays  *If you need a refill on your cardiac medications before your next appointment, please call your pharmacy*   Lab Work: Your physician recommends that you return for lab work in: TODAY BMP,Magnesium  If you have labs (blood work) drawn today and your tests are completely normal, you will receive your results only by: Marland Kitchen MyChart Message (if you have MyChart) OR . A paper copy in the mail If you have any lab test that is abnormal or we need to change your treatment, we will call you to review the results.   Testing/Procedures: NOne   Follow-Up: At [facility], you and your health needs are our priority.  As part of our continuing mission to provide you with exceptional heart care, we have created designated Provider Care Teams.  These Care Teams include your primary Cardiologist (physician) and Advanced Practice Providers (APPs -  Physician Assistants and Nurse Practitioners) who all work together to provide you with the care you need, when you need it.  We recommend signing up for the patient portal called "MyChart".  Sign up information is provided on this After Visit Summary.  MyChart is used to connect with patients for Virtual Visits (Telemedicine).  Patients are able to view lab/test results, encounter notes, upcoming appointments, etc.  Non-urgent messages can be sent to your provider as well.   To learn more about what you can do with MyChart, go to [email or web address].    Your next appointment:   1 month(s)  The format for your next appointment:   In Person  Provider:   Berniece Salines, DO   Other Instructions

## 2020-01-20 NOTE — Progress Notes (Signed)
Cardiology Office Note:    Date:  01/20/2020   ID:  Summer Goodman, DOB 1946/04/10, MRN [id number]  PCP:  Patient, No Pcp Per  Cardiologist:  No primary care provider on file.  Electrophysiologist:  None   Referring MD: Ardelle Anton, MD   Follow-up for hypertension  History of Present Illness:    Summer Goodman is a 74 y.o. female with a hx of hypertension, hyperlipidemia and paroxysmal atrial fibrillation ( now on amiodarone and Eliquis) presents today for a follow up visit.   I did see the patient on 10/15/2020. The was after a hospitalization were she was admitted at [facility] on November 12 for acute kidney injury with a creatinine of 4.  She was noted to have a UTI as well as a left pleural effusion.  While at [facility] she underwent a thoracocentesis with removal of 650 mL.  Due to worsening of her renal failure and infection she was transferred to [facility] for nephrology and critical care attention.  While at [facility] she developed atrial fibrillation with rapid ventricular rate.  She was seen by cardiology there she was started on amiodarone as well as Eliquis.  At the conclusion of our visit I recommended that the patient convert to taking her Amiodarone 200 mg daily starting 10/21/2020.   The patient offers no complaints at this time.  Past Medical History:  Diagnosis Date  . Acquired absence of both cervix and uterus 07/11/2012  . Acute cystitis without hematuria   . Acute respiratory distress   . Acute respiratory failure with hypoxia ([place])   . AKI (acute kidney injury) ([place]) 09/28/2019  . Anxiety 07/11/2012  . Anxiety with depression 10/27/2019  . Back pain 02/25/2014  . Dementia ([place])   . Diabetes ([place])   . Diabetes mellitus type 2 in nonobese ([place]) 04/18/2016  . Edema 07/11/2012  . Essential (primary) hypertension 07/11/2012  . Gastro-esophageal reflux disease without esophagitis 07/11/2012  . Headache, unspecified 07/11/2012  . High  risk medication use 03/25/2015  . Hyperlipidemia   . Hyperlipidemia, unspecified 07/11/2012  . Hypertension   . Hypokalemia 07/11/2012  . Osteoporosis   . Other long term (current) drug therapy 03/25/2015  . Recurrent pleural effusion on left   . Restless legs syndrome 02/23/2016  . Urinary incontinence 07/11/2012  . Weight loss 03/03/2013    History reviewed. No pertinent surgical history.  Current Medications: Current Meds  Medication Sig  . clonazePAM (KLONOPIN) 1 MG tablet Take 1 tablet (1 mg total) by mouth 2 (two) times daily as needed for anxiety.  Marland Kitchen glipiZIDE (GLUCOTROL) 5 MG tablet Take 1 tablet (5 mg total) by mouth 2 (two) times daily.  Marland Kitchen PARoxetine (PAXIL) 20 MG tablet Take 1 tablet (20 mg total) by mouth daily.  . pioglitazone (ACTOS) 30 MG tablet Take 1 tablet (30 mg total) by mouth daily.  . pravastatin (PRAVACHOL) 10 MG tablet Take 1 tablet (10 mg total) by mouth daily.  Marland Kitchen PROLENSA 0.07 % SOLN Place 1 drop into the left eye daily.     Allergies:   Metformin and related   Social History   Socioeconomic History  . Marital status: Married    Spouse name: Not on file  . Number of children: Not on file  . Years of education: Not on file  . Highest education level: Not on file  Occupational History  . Not on file  Tobacco Use  . Smoking status: Former Research scientist (life sciences)  . Smokeless tobacco: Former  User  Substance and Sexual Activity  . Alcohol use: Not on file  . Drug use: Not on file  . Sexual activity: Not on file  Other Topics Concern  . Not on file  Social History Narrative  . Not on file   Social Determinants of Health   Financial Resource Strain:   . Difficulty of Paying Living Expenses: Not on file  Food Insecurity:   . Worried About Charity fundraiser in the Last Year: Not on file  . Ran Out of Food in the Last Year: Not on file  Transportation Needs:   . Lack of Transportation (Medical): Not on file  . Lack of Transportation (Non-Medical): Not on file    Physical Activity:   . Days of Exercise per Week: Not on file  . Minutes of Exercise per Session: Not on file  Stress:   . Feeling of Stress : Not on file  Social Connections:   . Frequency of Communication with Friends and Family: Not on file  . Frequency of Social Gatherings with Friends and Family: Not on file  . Attends Religious Services: Not on file  . Active Member of Clubs or Organizations: Not on file  . Attends Archivist Meetings: Not on file  . Marital Status: Not on file     Family History: The patient's family history includes Alzheimer's disease in her father; Hyperlipidemia in her sister; Leukemia in her sister; Lung cancer in her sister; Ovarian cancer in her mother.  ROS:   Review of Systems  Constitution: Negative for decreased appetite, fever and weight gain.  HENT: Negative for congestion, ear discharge, hoarse voice and sore throat.   Eyes: Negative for discharge, redness, vision loss in right eye and visual halos.  Cardiovascular: Negative for chest pain, dyspnea on exertion, leg swelling, orthopnea and palpitations.  Respiratory: Negative for cough, hemoptysis, shortness of breath and snoring.   Endocrine: Negative for heat intolerance and polyphagia.  Hematologic/Lymphatic: Negative for bleeding problem. Does not bruise/bleed easily.  Skin: Negative for flushing, nail changes, rash and suspicious lesions.  Musculoskeletal: Negative for arthritis, joint pain, muscle cramps, myalgias, neck pain and stiffness.  Gastrointestinal: Negative for abdominal pain, bowel incontinence, diarrhea and excessive appetite.  Genitourinary: Negative for decreased libido, genital sores and incomplete emptying.  Neurological: Negative for brief paralysis, focal weakness, headaches and loss of balance.  Psychiatric/Behavioral: Negative for altered mental status, depression and suicidal ideas.  Allergic/Immunologic: Negative for HIV exposure and persistent infections.     EKGs/Labs/Other Studies Reviewed:    The following studies were reviewed today:   EKG:  None today   Her echocardiogram performed at [facility] on 09/26/2019 reported LVEF of 60 to 65%.  Pseudonormal LV filling pattern, with elevated left atrial pressure, mild mitral regurgitation.  Recent Labs: 09/28/2019: ALT 16 10/16/2019: Hemoglobin 13.1; Platelets 307 10/27/2019: BUN 18; Creatinine, Ser 1.17; Magnesium 2.0; Potassium 4.4; Sodium 142  Recent Lipid Panel No results found for: CHOL, TRIG, HDL, CHOLHDL, VLDL, LDLCALC, LDLDIRECT  Physical Exam:    VS:  BP 132/60   Pulse 67   Temp (!) 97.3 F (36.3 C)   Ht 5' (1.524 m)   Wt 136 lb 12.8 oz (62.1 kg)   SpO2 96%   BMI 26.72 kg/m     Wt Readings from Last 3 Encounters:  01/20/20 136 lb 12.8 oz (62.1 kg)  10/27/19 140 lb 2 oz (63.6 kg)  10/16/19 137 lb (62.1 kg)     GEN: Well  nourished, well developed in no acute distress HEENT: Normal NECK: No JVD; No carotid bruits LYMPHATICS: No lymphadenopathy CARDIAC: S1S2 noted,RRR, no murmurs, rubs, gallops RESPIRATORY:  Clear to auscultation without rales, wheezing or rhonchi  ABDOMEN: Soft, non-tender, non-distended, +bowel sounds, no guarding. EXTREMITIES: Bilateral ankle edema, No cyanosis, no clubbing MUSCULOSKELETAL:  No deformity  SKIN: Warm and dry NEUROLOGIC:  Alert and oriented x 3, non-focal PSYCHIATRIC:  Normal affect, good insight  ASSESSMENT:    1. Essential (primary) hypertension   2. PAF (paroxysmal atrial fibrillation) ([place])   3. Mixed hyperlipidemia    PLAN:     1.  EKG today shows sinus rhythm.  We will continue patient on amiodarone 200 mg daily, Eliquis 5 mg twice a day with Cardizem 180 mg daily.  2.  Bilateral leg edema we will do cautious diuretics with Lasix 20 mg Tuesday Thursday and Saturdays with potassium supplement.  3.  Continue pravastatin for hyperlipidemia.  4.  Blood work will be performed today for BMP and PCP  The  patient is in agreement with the above plan. The patient left the office in stable condition.  The patient will follow up in 1 month due to the medication.   Medication Adjustments/Labs and Tests Ordered: Current medicines are reviewed at length with the patient today.  Concerns regarding medicines are outlined above.  Orders Placed This Encounter  Procedures  . EKG 12-Lead   No orders of the defined types were placed in this encounter.   There are no Patient Instructions on file for this visit.   Adopting a Healthy Lifestyle.  Know what a healthy weight is for you (roughly BMI <25) and aim to maintain this   Aim for 7+ servings of fruits and vegetables daily   65-80+ fluid ounces of water or unsweet tea for healthy kidneys   Limit to max 1 drink of alcohol per day; avoid smoking/tobacco   Limit animal fats in diet for cholesterol and heart health - choose grass fed whenever available   Avoid highly processed foods, and foods high in saturated/trans fats   Aim for low stress - take time to unwind and care for your mental health   Aim for 150 min of moderate intensity exercise weekly for heart health, and weights twice weekly for bone health   Aim for 7-9 hours of sleep daily   When it comes to diets, agreement about the perfect plan isnt easy to find, even among the experts. Experts at the [employer] developed an idea known as the Healthy Eating Plate. Just imagine a plate divided into logical, healthy portions.   The emphasis is on diet quality:   Load up on vegetables and fruits - one-half of your plate: Aim for color and variety, and remember that potatoes dont count.   Go for whole grains - one-quarter of your plate: Whole wheat, barley, wheat berries, quinoa, oats, brown rice, and foods made with them. If you want pasta, go with whole wheat pasta.   Protein power - one-quarter of your plate: Fish, chicken, beans, and nuts are all healthy,  versatile protein sources. Limit red meat.   The diet, however, does go beyond the plate, offering a few other suggestions.   Use healthy plant oils, such as olive, canola, soy, corn, sunflower and peanut. Check the labels, and avoid partially hydrogenated oil, which have unhealthy trans fats.   If youre thirsty, drink water. Coffee and tea are good in moderation, but skip sugary  drinks and limit milk and dairy products to one or two daily servings.   The type of carbohydrate in the diet is more important than the amount. Some sources of carbohydrates, such as vegetables, fruits, whole grains, and beans-are healthier than others.   Finally, stay active  Signed, Berniece Salines, DO  01/20/2020 2:48 PM    [facility]

## 2020-01-21 ENCOUNTER — Telehealth: Payer: Self-pay | Admitting: Cardiology

## 2020-01-21 ENCOUNTER — Telehealth: Payer: Self-pay

## 2020-01-21 LAB — MAGNESIUM: Magnesium: 2.2 mg/dL (ref 1.6–2.3)

## 2020-01-21 LAB — BASIC METABOLIC PANEL
BUN/Creatinine Ratio: 20 (ref 12–28)
BUN: 19 mg/dL (ref 8–27)
CO2: 25 mmol/L (ref 20–29)
Calcium: 9.3 mg/dL (ref 8.7–10.3)
Chloride: 102 mmol/L (ref 96–106)
Creatinine, Ser: 0.94 mg/dL (ref 0.57–1.00)
GFR calc Af Amer: 69 mL/min/{1.73_m2} (ref >59)
GFR calc non Af Amer: 60 mL/min/{1.73_m2} (ref >59)
Glucose: 116 mg/dL — ABNORMAL HIGH (ref 65–99)
Potassium: 4.6 mmol/L (ref 3.5–5.2)
Sodium: 142 mmol/L (ref 134–144)

## 2020-01-21 NOTE — Telephone Encounter (Signed)
The patient has been notified of the result and verbalized understanding.  All questions (if any) were answered. Antonieta Iba, RN 01/21/2020 1:18 PM

## 2020-01-21 NOTE — Telephone Encounter (Signed)
[id number] 3/10 

## 2020-01-21 NOTE — Telephone Encounter (Signed)
New Message ° ° °Patient is returning call in reference to lab results.  °

## 2020-01-21 NOTE — Telephone Encounter (Signed)
-----   Message from Berniece Salines, DO sent at 01/21/2020 12:11 PM EST ----- Glucose elevated slightly but otherwise normal labs.

## 2020-02-24 ENCOUNTER — Other Ambulatory Visit: Payer: Self-pay

## 2020-02-24 ENCOUNTER — Encounter: Payer: Self-pay | Admitting: Cardiology

## 2020-02-24 ENCOUNTER — Ambulatory Visit: Payer: Medicare Other | Admitting: Cardiology

## 2020-02-24 VITALS — BP 130/66 | HR 64 | Ht 60.0 in | Wt 141.0 lb

## 2020-02-24 DIAGNOSIS — I10 Essential (primary) hypertension: Secondary | ICD-10-CM

## 2020-02-24 DIAGNOSIS — I480 Paroxysmal atrial fibrillation: Secondary | ICD-10-CM

## 2020-02-24 DIAGNOSIS — E782 Mixed hyperlipidemia: Secondary | ICD-10-CM

## 2020-02-24 DIAGNOSIS — E119 Type 2 diabetes mellitus without complications: Secondary | ICD-10-CM | POA: Diagnosis not present

## 2020-02-24 MED ORDER — POTASSIUM CHLORIDE CRYS ER 20 MEQ PO TBCR: 20.0000 meq | EXTENDED_RELEASE_TABLET | ORAL | 3 refills | Status: DC

## 2020-02-24 MED ORDER — FUROSEMIDE 20 MG PO TABS: 20.0000 mg | ORAL_TABLET | ORAL | 3 refills | Status: DC

## 2020-02-24 NOTE — Progress Notes (Signed)
Cardiology Office Note:    Date:  02/24/2020   ID:  Summer Goodman, DOB 02-Apr-1946, MRN [id number]  PCP:  Ronita Hipps, MD  Cardiologist:  Berniece Salines, DO  Electrophysiologist:  None   Referring MD: No ref. provider found   Chief Complaint  Patient presents with  . Follow-up  Follow up for atrial fibrillation and hypertension.  History of Present Illness:    Summer Goodman is a 74 y.o. female with a hx of hypertension, hyperlipidemia and paroxysmal atrial fibrillation ( now on amiodarone and Eliquis) presents today for a follow up visit.  I last saw the patient on January 20, 2020 at that time he did have some significant bilateral leg edema therefore I started patient on cautious diuretics of Lasix 20 mg on Tuesday Thursdays and Saturday with potassium supplement.  She is here today for follow-up visit she tells me that she is doing well.  Her leg edema has significantly improved and she is happy about this.    No complaints at this time.  Past Medical History:  Diagnosis Date  . Acquired absence of both cervix and uterus 07/11/2012  . Acute cystitis without hematuria   . Acute respiratory distress   . Acute respiratory failure with hypoxia ([place])   . AKI (acute kidney injury) ([place]) 09/28/2019  . Anxiety 07/11/2012  . Anxiety with depression 10/27/2019  . Back pain 02/25/2014  . Dementia ([place])   . Diabetes ([place])   . Diabetes mellitus type 2 in nonobese ([place]) 04/18/2016  . Edema 07/11/2012  . Essential (primary) hypertension 07/11/2012  . Gastro-esophageal reflux disease without esophagitis 07/11/2012  . Headache, unspecified 07/11/2012  . High risk medication use 03/25/2015  . Hyperlipidemia   . Hyperlipidemia, unspecified 07/11/2012  . Hypertension   . Hypokalemia 07/11/2012  . Osteoporosis   . Other long term (current) drug therapy 03/25/2015  . Recurrent pleural effusion on left   . Restless legs syndrome 02/23/2016  . Urinary incontinence 07/11/2012  . Weight loss 03/03/2013      No past surgical history on file.  Current Medications: Current Meds  Medication Sig  . amiodarone (PACERONE) 200 MG tablet Take 1 tablet (200 mg total) by mouth daily. Starting on 12/9, decrease to 1 tablet daily  . apixaban (ELIQUIS) 5 MG TABS tablet Take 1 tablet (5 mg total) by mouth 2 (two) times daily.  . clonazePAM (KLONOPIN) 2 MG tablet Take 2 mg by mouth 2 (two) times daily as needed.  . diltiazem (CARDIZEM CD) 180 MG 24 hr capsule Take 1 capsule (180 mg total) by mouth daily.  Marland Kitchen glipiZIDE (GLUCOTROL) 5 MG tablet Take 1 tablet (5 mg total) by mouth 2 (two) times daily.  Marland Kitchen PARoxetine (PAXIL) 20 MG tablet Take 1 tablet (20 mg total) by mouth daily.  . pioglitazone (ACTOS) 30 MG tablet Take 1 tablet (30 mg total) by mouth daily.  . pravastatin (PRAVACHOL) 10 MG tablet Take 1 tablet (10 mg total) by mouth daily.  Marland Kitchen PROLENSA 0.07 % SOLN Place 1 drop into the left eye daily.  . [DISCONTINUED] clonazePAM (KLONOPIN) 1 MG tablet Take 1 tablet (1 mg total) by mouth 2 (two) times daily as needed for anxiety. (Patient taking differently: Take 2 mg by mouth daily. )  . [DISCONTINUED] furosemide (LASIX) 20 MG tablet Take 1 tab on Tues,Thurs, and Saturdays  . [DISCONTINUED] potassium chloride SA (KLOR-CON) 20 MEQ tablet Take 1 tab on Tues, Thurs, and Saturdays     Allergies:   Metformin  and related   Social History   Socioeconomic History  . Marital status: Married    Spouse name: Not on file  . Number of children: Not on file  . Years of education: Not on file  . Highest education level: Not on file  Occupational History  . Not on file  Tobacco Use  . Smoking status: Former Research scientist (life sciences)  . Smokeless tobacco: Former Network engineer and Sexual Activity  . Alcohol use: Not on file  . Drug use: Not on file  . Sexual activity: Not on file  Other Topics Concern  . Not on file  Social History Narrative  . Not on file   Social Determinants of Health   Financial Resource Strain:   .  Difficulty of Paying Living Expenses:   Food Insecurity:   . Worried About Charity fundraiser in the Last Year:   . Arboriculturist in the Last Year:   Transportation Needs:   . Film/video editor (Medical):   Marland Kitchen Lack of Transportation (Non-Medical):   Physical Activity:   . Days of Exercise per Week:   . Minutes of Exercise per Session:   Stress:   . Feeling of Stress :   Social Connections:   . Frequency of Communication with Friends and Family:   . Frequency of Social Gatherings with Friends and Family:   . Attends Religious Services:   . Active Member of Clubs or Organizations:   . Attends Archivist Meetings:   Marland Kitchen Marital Status:      Family History: The patient's family history includes Alzheimer's disease in her father; Hyperlipidemia in her sister; Leukemia in her sister; Lung cancer in her sister; Ovarian cancer in her mother.  ROS:   Review of Systems  Constitution: Negative for decreased appetite, fever and weight gain.  HENT: Negative for congestion, ear discharge, hoarse voice and sore throat.   Eyes: Negative for discharge, redness, vision loss in right eye and visual halos.  Cardiovascular: Negative for chest pain, dyspnea on exertion, leg swelling, orthopnea and palpitations.  Respiratory: Negative for cough, hemoptysis, shortness of breath and snoring.   Endocrine: Negative for heat intolerance and polyphagia.  Hematologic/Lymphatic: Negative for bleeding problem. Does not bruise/bleed easily.  Skin: Negative for flushing, nail changes, rash and suspicious lesions.  Musculoskeletal: Negative for arthritis, joint pain, muscle cramps, myalgias, neck pain and stiffness.  Gastrointestinal: Negative for abdominal pain, bowel incontinence, diarrhea and excessive appetite.  Genitourinary: Negative for decreased libido, genital sores and incomplete emptying.  Neurological: Negative for brief paralysis, focal weakness, headaches and loss of balance.    Psychiatric/Behavioral: Negative for altered mental status, depression and suicidal ideas.  Allergic/Immunologic: Negative for HIV exposure and persistent infections.    EKGs/Labs/Other Studies Reviewed:    The following studies were reviewed today:   EKG: None today  Her echocardiogram performed at [facility] on 09/26/2019 reported LVEF of 60 to 65%. Pseudonormal LV filling pattern, with elevated left atrial pressure, mild mitral regurgitation.  Recent Labs: 09/28/2019: ALT 16 10/16/2019: Hemoglobin 13.1; Platelets 307 01/20/2020: BUN 19; Creatinine, Ser 0.94; Magnesium 2.2; Potassium 4.6; Sodium 142  Recent Lipid Panel No results found for: CHOL, TRIG, HDL, CHOLHDL, VLDL, LDLCALC, LDLDIRECT  Physical Exam:    VS:  BP 130/66 (BP Location: Right Arm, Patient Position: Sitting, Cuff Size: Normal)   Pulse 64   Ht 5' (1.524 m)   Wt 141 lb (64 kg)   SpO2 94%   BMI 27.54 kg/m  Wt Readings from Last 3 Encounters:  02/24/20 141 lb (64 kg)  01/20/20 136 lb 12.8 oz (62.1 kg)  10/27/19 140 lb 2 oz (63.6 kg)     GEN: Well nourished, well developed in no acute distress HEENT: Normal NECK: No JVD; No carotid bruits LYMPHATICS: No lymphadenopathy CARDIAC: S1S2 noted,RRR, no murmurs, rubs, gallops RESPIRATORY:  Clear to auscultation without rales, wheezing or rhonchi  ABDOMEN: Soft, non-tender, non-distended, +bowel sounds, no guarding. EXTREMITIES: No edema, No cyanosis, no clubbing MUSCULOSKELETAL:  No deformity  SKIN: Warm and dry NEUROLOGIC:  Alert and oriented x 3, non-focal PSYCHIATRIC:  Normal affect, good insight  ASSESSMENT:    1. Essential (primary) hypertension   2. Paroxysmal atrial fibrillation (H[facility])   3. PAF (paroxysmal atrial fibrillation) ([place])   4. Diabetes mellitus type 2 in nonobese (H[facility])   5. Mixed hyperlipidemia    PLAN:     1.  Diastolic heart failure-compensated.  We will going to decrease her Lasix milligrams weekly with potassium  supplement.  2.  Paroxysmal atrial fibrillation-continue patient on Eliquis 5 mg twice a day, Cardizem 180 mg daily as well as amiodarone 200 mg daily.  3.  Hyperlipidemia continue patient on pravastatin.  4.  Will be done today to assess for electrolyte abnormalities and kidney function.  5.  Diabetes mellitus continue management per primary doctor.  The patient is in agreement with the above plan. The patient left the office in stable condition.  The patient will follow up in 6 months or sooner if needed.   Medication Adjustments/Labs and Tests Ordered: Current medicines are reviewed at length with the patient today.  Concerns regarding medicines are outlined above.  Orders Placed This Encounter  Procedures  . Basic metabolic panel  . Magnesium   Meds ordered this encounter  Medications  . furosemide (LASIX) 20 MG tablet    Sig: Take 1 tablet (20 mg total) by mouth once a week.    Dispense:  90 tablet    Refill:  3  . potassium chloride SA (KLOR-CON) 20 MEQ tablet    Sig: Take 1 tablet (20 mEq total) by mouth once a week.    Dispense:  90 tablet    Refill:  3    There are no Patient Instructions on file for this visit.   Adopting a Healthy Lifestyle.  Know what a healthy weight is for you (roughly BMI <25) and aim to maintain this   Aim for 7+ servings of fruits and vegetables daily   65-80+ fluid ounces of water or unsweet tea for healthy kidneys   Limit to max 1 drink of alcohol per day; avoid smoking/tobacco   Limit animal fats in diet for cholesterol and heart health - choose grass fed whenever available   Avoid highly processed foods, and foods high in saturated/trans fats   Aim for low stress - take time to unwind and care for your mental health   Aim for 150 min of moderate intensity exercise weekly for heart health, and weights twice weekly for bone health   Aim for 7-9 hours of sleep daily   When it comes to diets, agreement about the perfect plan  isnt easy to find, even among the experts. Experts at the [employer] developed an idea known as the Healthy Eating Plate. Just imagine a plate divided into logical, healthy portions.   The emphasis is on diet quality:   Load up on vegetables and fruits - one-half of your plate:  Aim for color and variety, and remember that potatoes dont count.   Go for whole grains - one-quarter of your plate: Whole wheat, barley, wheat berries, quinoa, oats, brown rice, and foods made with them. If you want pasta, go with whole wheat pasta.   Protein power - one-quarter of your plate: Fish, chicken, beans, and nuts are all healthy, versatile protein sources. Limit red meat.   The diet, however, does go beyond the plate, offering a few other suggestions.   Use healthy plant oils, such as olive, canola, soy, corn, sunflower and peanut. Check the labels, and avoid partially hydrogenated oil, which have unhealthy trans fats.   If youre thirsty, drink water. Coffee and tea are good in moderation, but skip sugary drinks and limit milk and dairy products to one or two daily servings.   The type of carbohydrate in the diet is more important than the amount. Some sources of carbohydrates, such as vegetables, fruits, whole grains, and beans-are healthier than others.   Finally, stay active  Signed, Berniece Salines, DO  02/24/2020 2:57 PM    [facility]

## 2020-02-24 NOTE — Patient Instructions (Signed)
Medication Instructions:  Your physician has recommended you make the following change in your medication:  1.  REDUCE Lasix and Potassium to 1 time a week = TUESDAYS   *If you need a refill on your cardiac medications before your next appointment, please call your pharmacy*   Lab Work: TODAY:  BMET & MAG  If you have labs (blood work) drawn today and your tests are completely normal, you will receive your results only by: Marland Kitchen MyChart Message (if you have MyChart) OR . A paper copy in the mail If you have any lab test that is abnormal or we need to change your treatment, we will call you to review the results.   Testing/Procedures: None ordered   Follow-Up: At [facility], you and your health needs are our priority.  As part of our continuing mission to provide you with exceptional heart care, we have created designated Provider Care Teams.  These Care Teams include your primary Cardiologist (physician) and Advanced Practice Providers (APPs -  Physician Assistants and Nurse Practitioners) who all work together to provide you with the care you need, when you need it.  We recommend signing up for the patient portal called "MyChart".  Sign up information is provided on this After Visit Summary.  MyChart is used to connect with patients for Virtual Visits (Telemedicine).  Patients are able to view lab/test results, encounter notes, upcoming appointments, etc.  Non-urgent messages can be sent to your provider as well.   To learn more about what you can do with MyChart, go to [email or web address].    Your next appointment:   6 month(s)  The format for your next appointment:   In Person  Provider:   Berniece Salines, DO   Other Instructions

## 2020-02-25 ENCOUNTER — Telehealth: Payer: Self-pay

## 2020-02-25 LAB — BASIC METABOLIC PANEL
BUN/Creatinine Ratio: 12 (ref 12–28)
BUN: 11 mg/dL (ref 8–27)
CO2: 28 mmol/L (ref 20–29)
Calcium: 8.8 mg/dL (ref 8.7–10.3)
Chloride: 105 mmol/L (ref 96–106)
Creatinine, Ser: 0.93 mg/dL (ref 0.57–1.00)
GFR calc Af Amer: 70 mL/min/{1.73_m2} (ref >59)
GFR calc non Af Amer: 61 mL/min/{1.73_m2} (ref >59)
Glucose: 145 mg/dL — ABNORMAL HIGH (ref 65–99)
Potassium: 3.6 mmol/L (ref 3.5–5.2)
Sodium: 145 mmol/L — ABNORMAL HIGH (ref 134–144)

## 2020-02-25 LAB — MAGNESIUM: Magnesium: 2.1 mg/dL (ref 1.6–2.3)

## 2020-02-25 NOTE — Telephone Encounter (Signed)
Left message on patients voicemail to please return our call.   

## 2020-02-25 NOTE — Telephone Encounter (Signed)
Patient called back

## 2020-02-25 NOTE — Telephone Encounter (Signed)
Spoke with patient regarding results.  Patient verbalizes understanding and is agreeable to plan of care. Advised patient to call back with any issues or concerns.  

## 2020-02-25 NOTE — Telephone Encounter (Signed)
Patient calling back.   °

## 2020-02-25 NOTE — Telephone Encounter (Signed)
-----   Message from Berniece Salines, DO sent at 02/25/2020  7:55 AM EDT ----- Sodium is slightly elevated, otherwise normal labs.

## 2020-03-31 ENCOUNTER — Telehealth: Payer: Self-pay | Admitting: Cardiology

## 2020-03-31 NOTE — Telephone Encounter (Signed)
   Pt c/o swelling: STAT is pt has developed SOB within 24 hours  1) How much weight have you gained and in what time span? Not sure  2) If swelling, where is the swelling located? Both feet up to the legs  3) Are you currently taking a fluid pill? Yes  4) Are you currently SOB? No  5) Do you have a log of your daily weights (if so, list)? None  6) Have you gained 3 pounds in a day or 5 pounds in a week? Not sure   7) Have you traveled recently? No  Pt said Dr. Harriet Masson took her off of hydrochlorothiazide and cut her fluid pills to once a week, her feet and legs is swelling up and can't barely walk.   Please advise

## 2020-03-31 NOTE — Telephone Encounter (Signed)
This looks quite complicated she is in sinus rhythm I suspect diltiazem is the problem and have her discontinue.  We could give her a small amount of a loop diuretic her renal function is normalized furosemide 20 mg every other day and tolerated discontinue when the swelling is resolved.

## 2020-04-01 NOTE — Telephone Encounter (Signed)
Spoke with patient regarding recommendations.  Patient verbalizes understanding as I had her teach-back these recommendations to me. She is agreeable to the plan of care. Advised patient to call back with any issues or concerns.

## 2020-04-01 NOTE — Telephone Encounter (Signed)
Tried calling patient. No answer and no voicemail set up for me to leave a message. 

## 2020-04-11 IMAGING — US US RENAL
1 series · 14 of 25 positions shown · non-contrast
Comparison: Prior CT from 09/25/2019.

CLINICAL DATA: Initial evaluation for acute renal insufficiency.

EXAM:
RENAL / URINARY TRACT ULTRASOUND COMPLETE

[Series 1: us renal · 42 acquisitions, 14 frames shown]
[im 1/42]
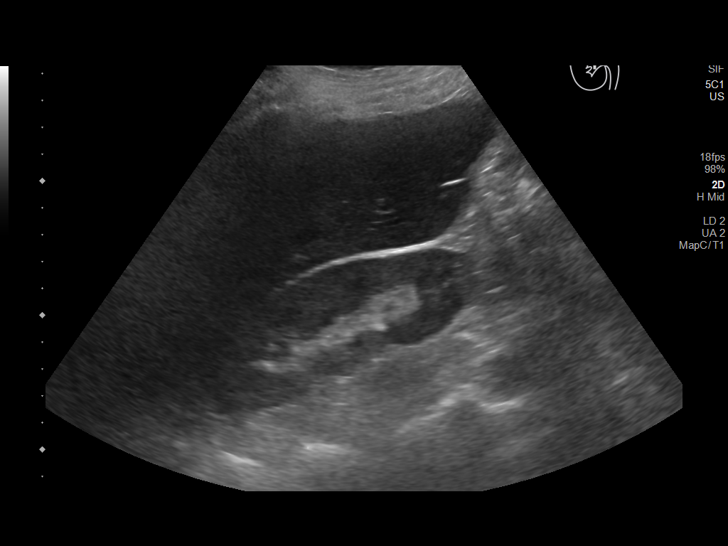
[im 4/42]
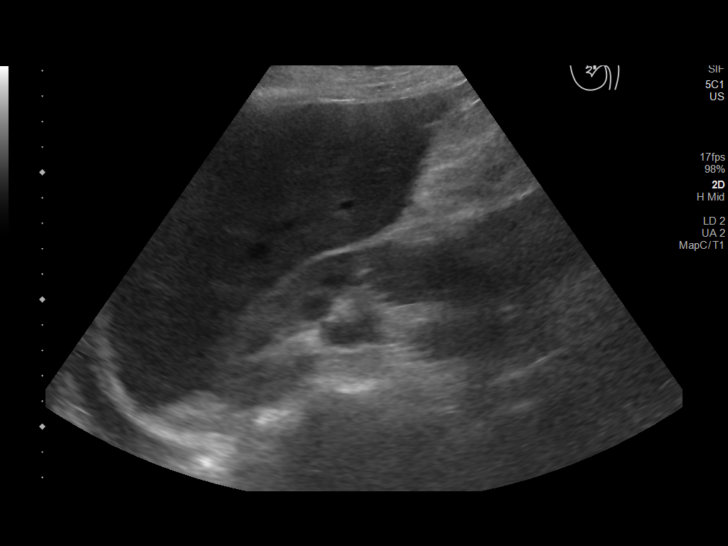
[im 7/42]
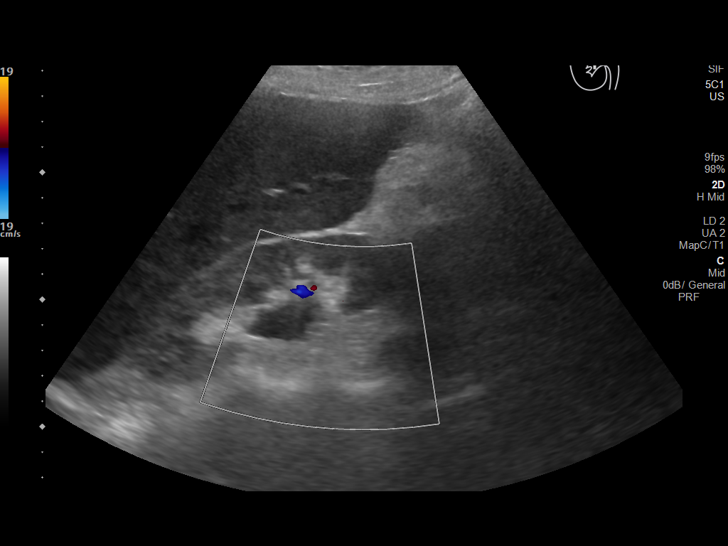
[im 11/42]
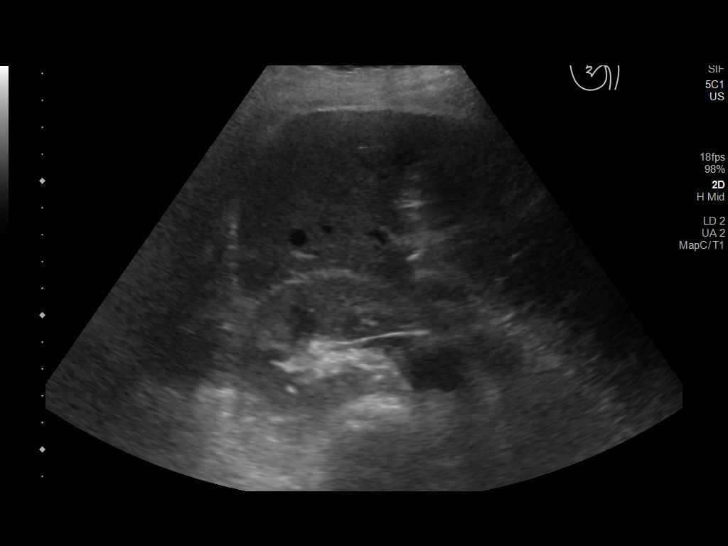
[im 14/42]
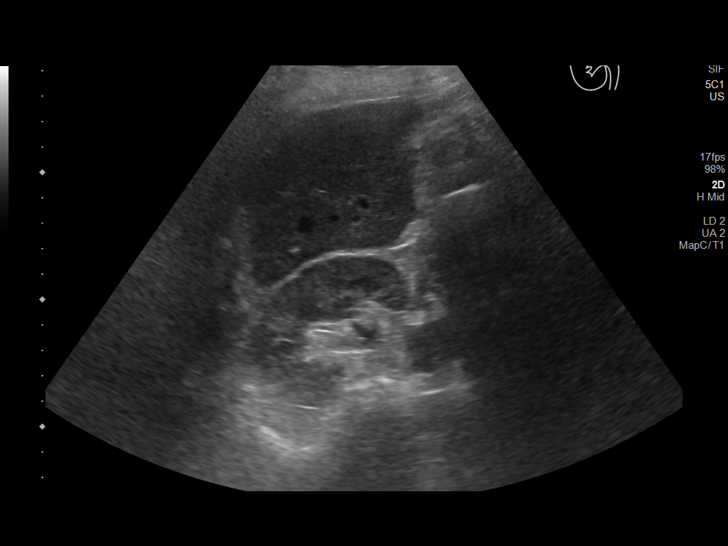
[im 16/42]
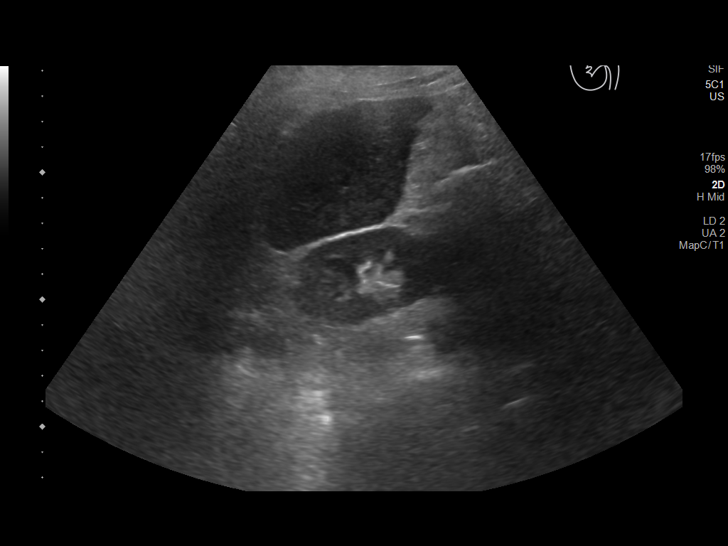
[im 19/42]
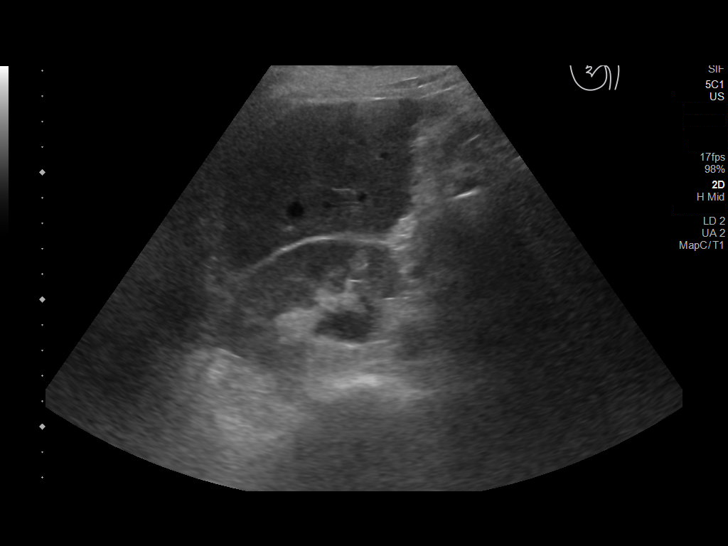
[im 23/42]
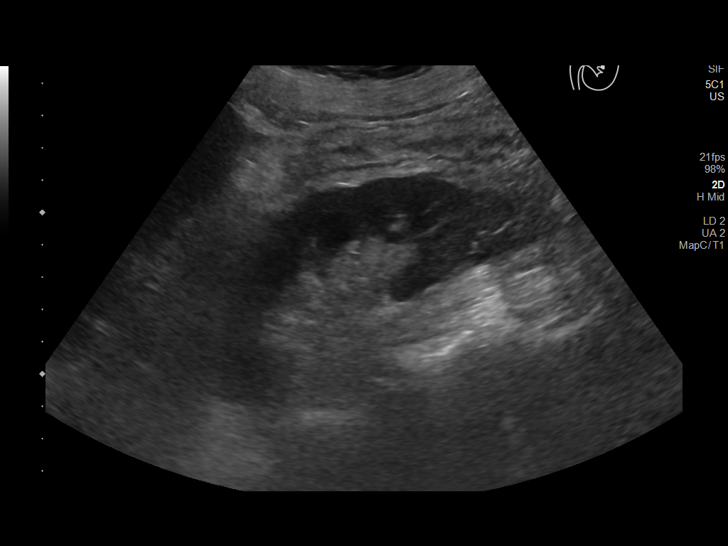
[im 26/42]
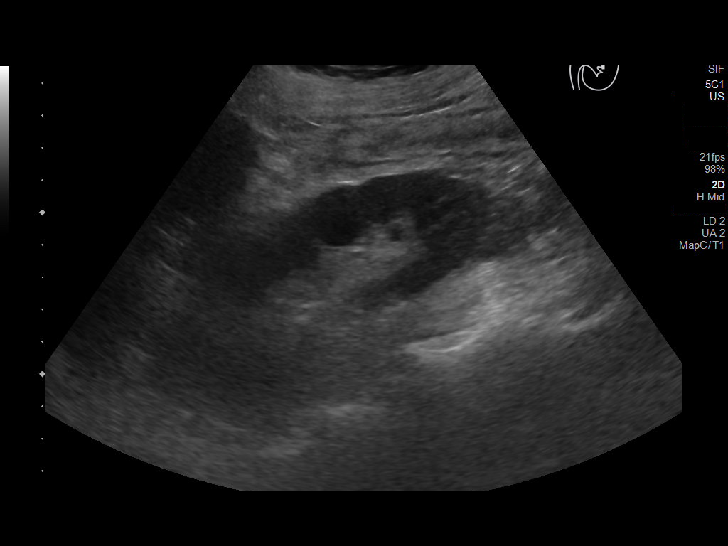
[im 28/42]
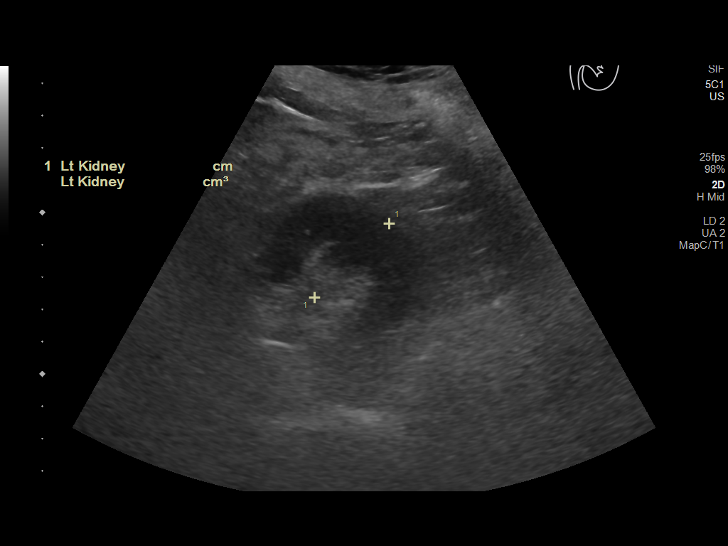
[im 31/42]
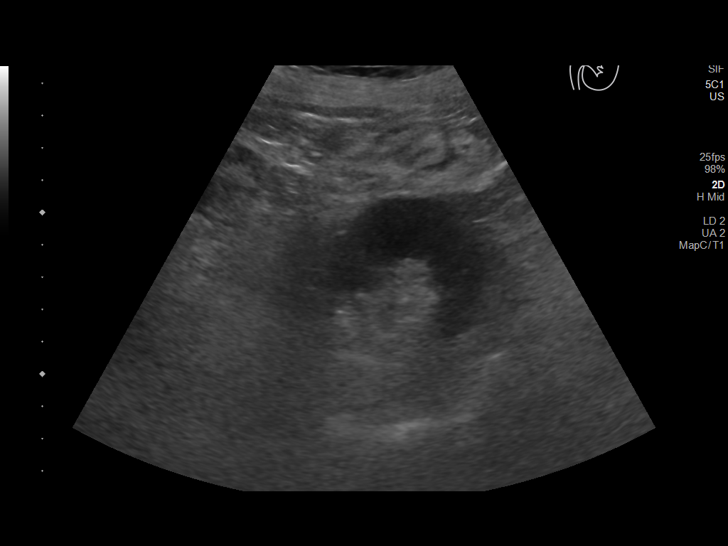
[im 35/42]
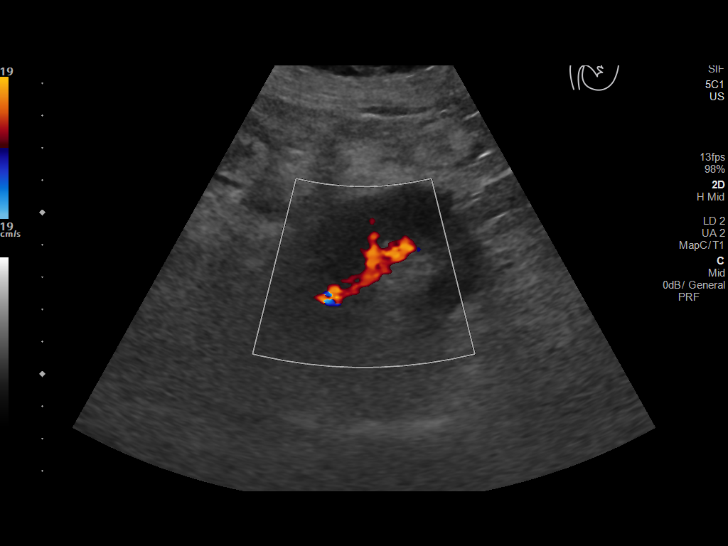
[im 38/42]
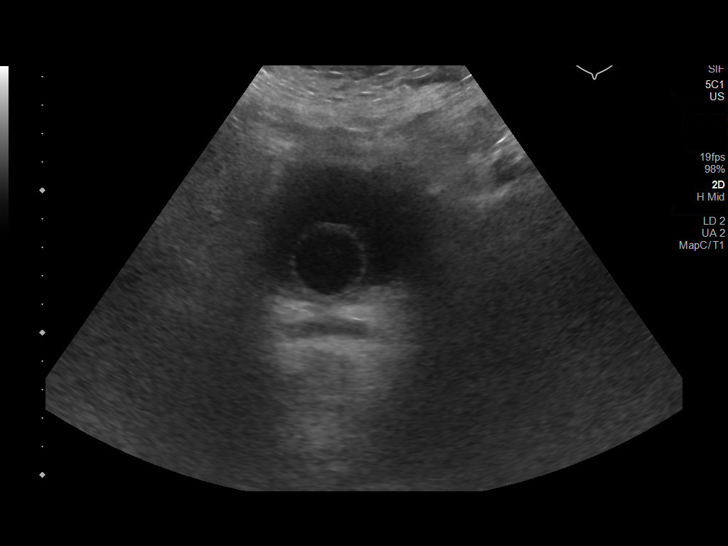
[im 42/42]
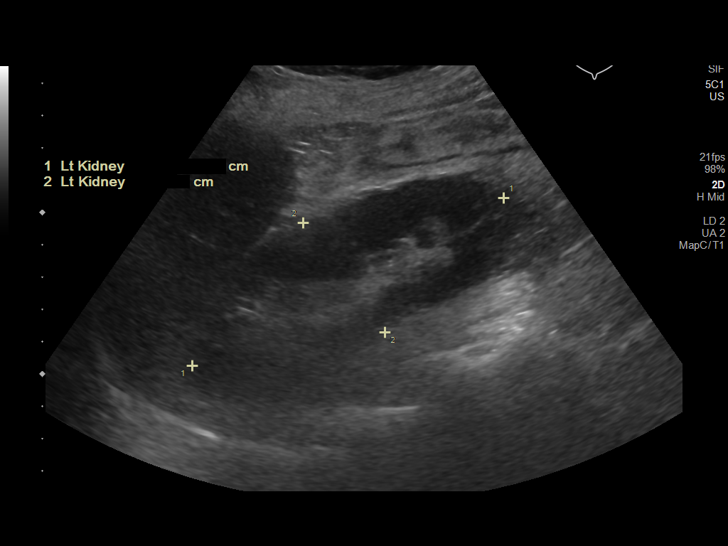

[14 of 25 positions shown; findings below may reference images not displayed]

FINDINGS: Right Kidney:

Renal measurements: 11.0 x 3.9 x 4.6 cm = volume: 101.1 mL.
Echogenicity within normal limits. No nephrolithiasis or
hydronephrosis. 9 mm echogenic lesion within the mid-lower right
kidney consistent with previously identified small AML. Small
extrarenal pelvis noted.

Left Kidney:

Renal measurements: 11.0 x 4.2 x 3.3 cm = volume: 78.8 mL.
Echogenicity within normal limits. No mass or hydronephrosis
visualized.

Bladder:

Decompressed with a Foley catheter in place.

Other:

None.
IMPRESSION: 1. No hydronephrosis or other acute finding.
2. 9 mm right renal AML, not significantly changed from previous.

## 2020-04-11 IMAGING — DX DG CHEST 1V PORT
1 series · 1 of 1 positions shown · non-contrast
Comparison: 09/27/2019

CLINICAL DATA: Shortness of breath

EXAM:
PORTABLE CHEST 1 VIEW

[chest]
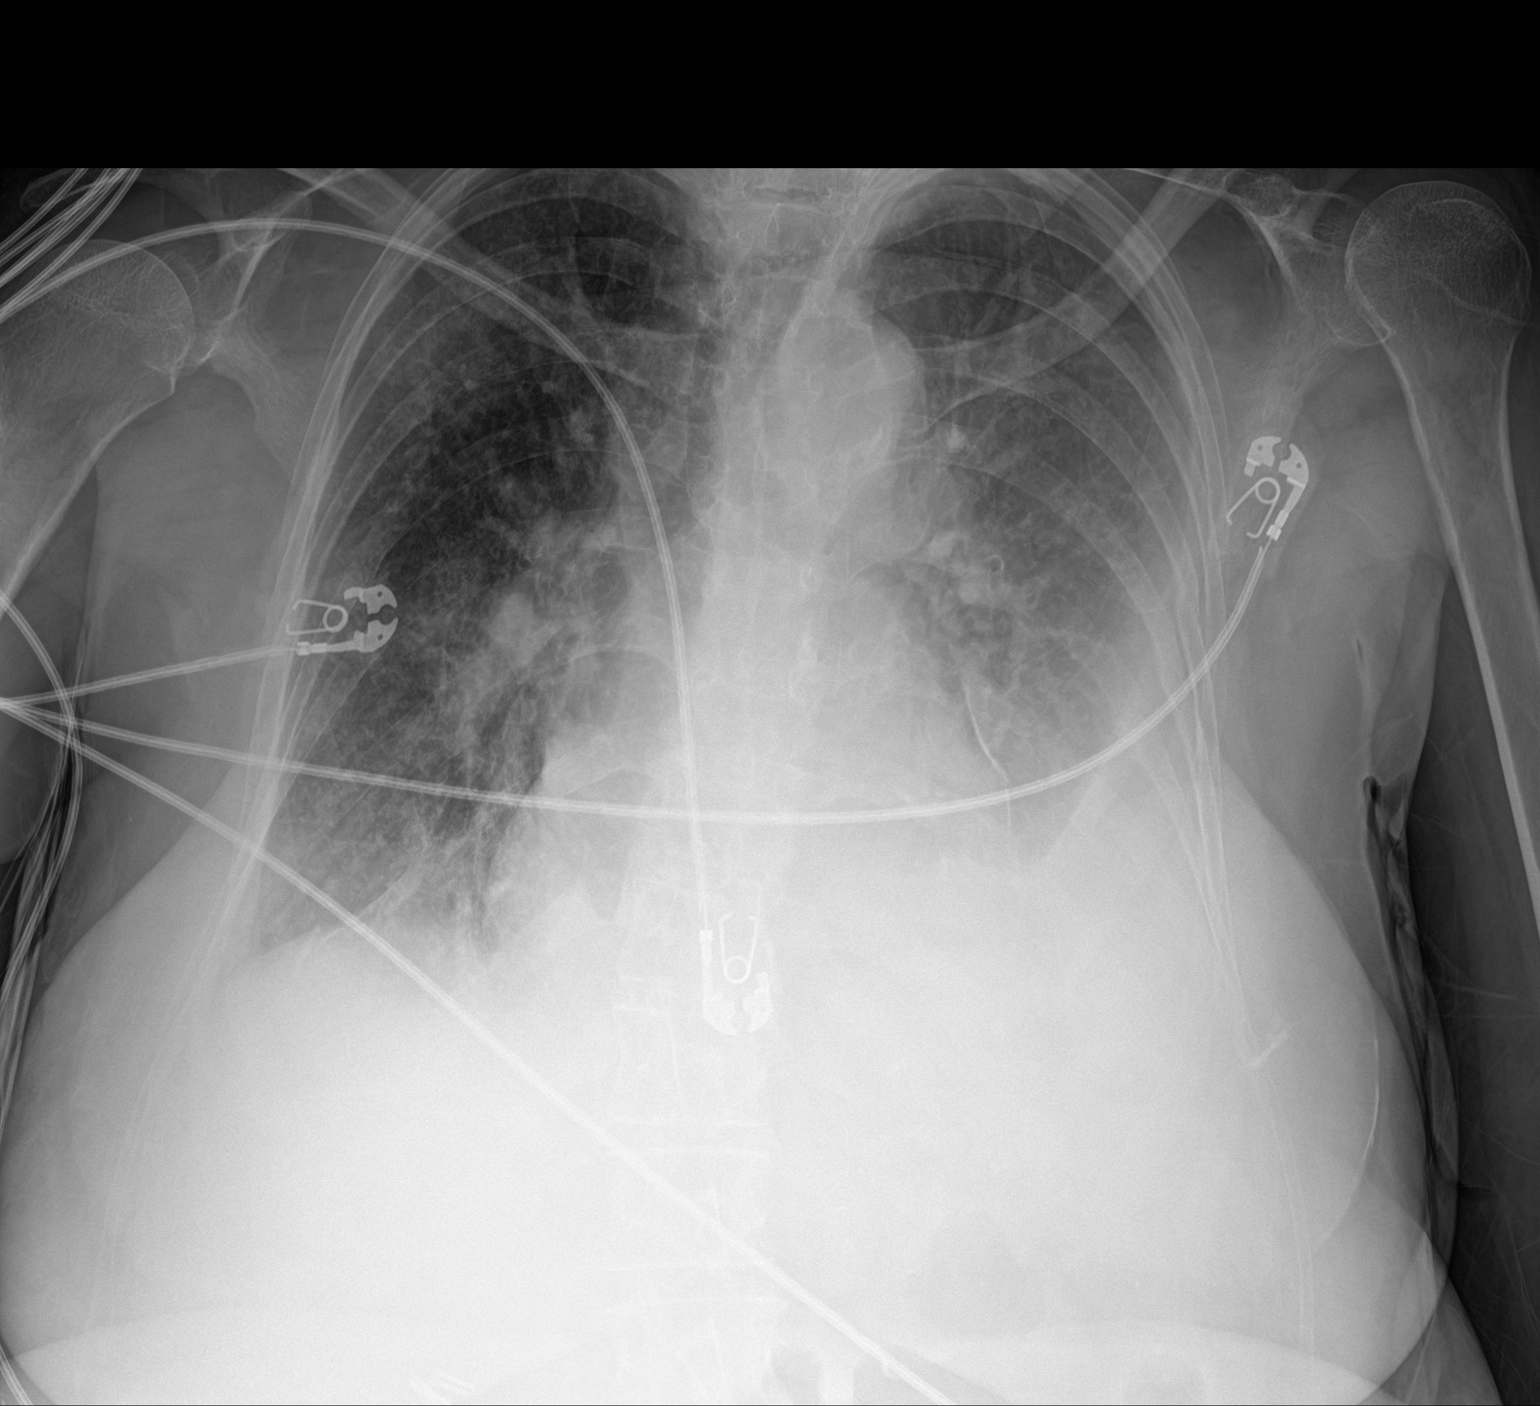

[1 of 1 positions shown; findings below may reference images not displayed]

FINDINGS: Moderate to large left pleural effusion, decreased since prior
study. Small right effusion likely present. Bibasilar atelectasis.
Mild vascular congestion. Heart is mildly enlarged.
IMPRESSION: Moderate to large left pleural effusion, decreased since prior
study. Suspect small right effusion.

Cardiomegaly, vascular congestion.

Bibasilar atelectasis.

## 2020-04-18 IMAGING — DX DG CHEST 1V PORT
1 series · 1 of 1 positions shown · non-contrast
Comparison: Three days ago

CLINICAL DATA: Pleural effusion

EXAM:
PORTABLE CHEST 1 VIEW

[chest]
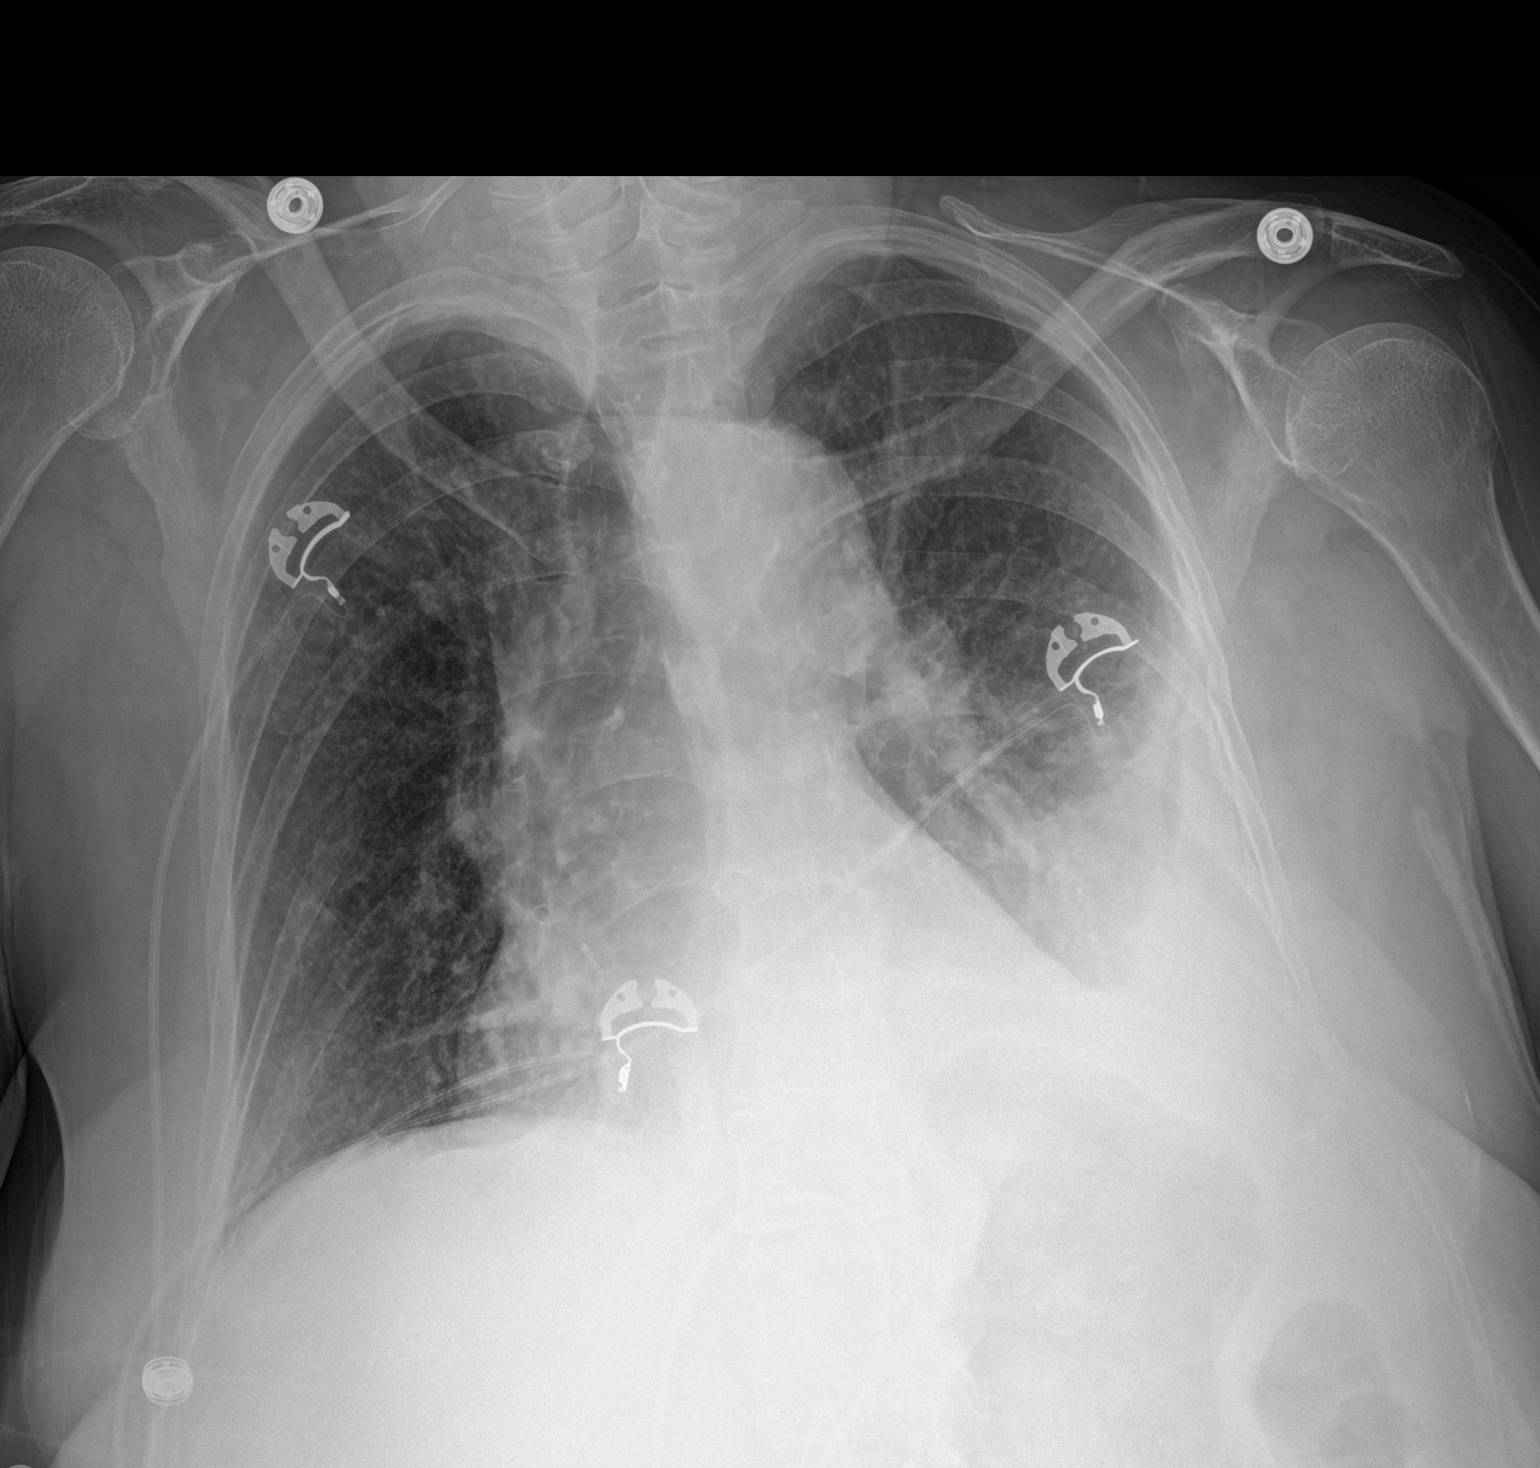

[1 of 1 positions shown; findings below may reference images not displayed]

FINDINGS: Opacity at the left base has the appearance of both pleural fluid
and airspace disease and is unchanged. Normal heart size. Aortic
tortuosity.

Nondisplaced lateral right clavicle fracture.
IMPRESSION: 1. Unchanged pleural and parenchymal opacity at the left base.
2. Nondisplaced distal right clavicle fracture.

## 2020-08-25 ENCOUNTER — Other Ambulatory Visit: Payer: Self-pay | Admitting: Cardiology

## 2020-10-11 ENCOUNTER — Other Ambulatory Visit: Payer: Self-pay | Admitting: Cardiology

## 2020-10-11 NOTE — Telephone Encounter (Signed)
Rx refill sent to pharmacy. 

## 2020-11-23 ENCOUNTER — Encounter: Payer: Self-pay | Admitting: Cardiology

## 2020-11-23 ENCOUNTER — Ambulatory Visit: Payer: Medicare Other | Admitting: Cardiology

## 2020-11-23 ENCOUNTER — Other Ambulatory Visit: Payer: Self-pay

## 2020-11-23 VITALS — BP 152/88 | HR 66 | Ht 63.0 in | Wt 144.8 lb

## 2020-11-23 DIAGNOSIS — I480 Paroxysmal atrial fibrillation: Secondary | ICD-10-CM

## 2020-11-23 DIAGNOSIS — I10 Essential (primary) hypertension: Secondary | ICD-10-CM | POA: Diagnosis not present

## 2020-11-23 DIAGNOSIS — E0800 Diabetes mellitus due to underlying condition with hyperosmolarity without nonketotic hyperglycemic-hyperosmolar coma (NKHHC): Secondary | ICD-10-CM

## 2020-11-23 NOTE — Progress Notes (Signed)
Cardiology Office Note:    Date:  11/23/2020   ID:  Summer Goodman, DOB [birth year], MRN [id number]  PCP:  Ronita Hipps, MD  Cardiologist:  Berniece Salines, DO  Electrophysiologist:  None   Referring MD: Ronita Hipps, MD   " I am doing fine"  History of Present Illness:    Summer Goodman is a 75 y.o. female with a hx of hypertension, hyperlipidemia and paroxysmal atrial fibrillation ( now on amiodarone and Eliquis) presents today for a follow up visit.  I saw the patient in April 2021 at that time she appeared to be doing well from a cardiovascular standpoint.  No medication changes were made.  Since her last visit she tells me she has been doing some chemical peels and is looking into getting a collagen restoration procedure done.  She is concerned as she was told that she is going to have significant bleeding and wanted to speak with me if the procedure is safe.  I did tell the patient that I do not perform these procedures therefore I cannot speak to the safety of the procedure.  I did advise her that she needs to speak with the doctor or persons who will be performing the procedure to understand more details.  Past Medical History:  Diagnosis Date  . Acquired absence of both cervix and uterus 07/11/2012  . Acute cystitis without hematuria   . Acute respiratory distress   . Acute respiratory failure with hypoxia ([place])   . AKI (acute kidney injury) ([place]) 09/28/2019  . Anxiety 07/11/2012  . Anxiety with depression 10/27/2019  . Back pain 02/25/2014  . Dementia ([place])   . Diabetes ([place])   . Diabetes mellitus type 2 in nonobese ([place]) 04/18/2016  . Edema 07/11/2012  . Essential (primary) hypertension 07/11/2012  . Gastro-esophageal reflux disease without esophagitis 07/11/2012  . Headache, unspecified 07/11/2012  . High risk medication use 03/25/2015  . Hyperlipidemia   . Hyperlipidemia, unspecified 07/11/2012  . Hypertension   . Hypokalemia 07/11/2012  . Osteoporosis   . Other long term  (current) drug therapy 03/25/2015  . Recurrent pleural effusion on left   . Restless legs syndrome 02/23/2016  . Urinary incontinence 07/11/2012  . Weight loss 03/03/2013    History reviewed. No pertinent surgical history.  Current Medications: Current Meds  Medication Sig  . amiodarone (PACERONE) 200 MG tablet Take 1 tablet (200 mg total) by mouth daily. Starting on 12/9, decrease to 1 tablet daily  . apixaban (ELIQUIS) 5 MG TABS tablet Take 1 tablet (5 mg total) by mouth 2 (two) times daily.  . clonazePAM (KLONOPIN) 2 MG tablet Take 2 mg by mouth 2 (two) times daily as needed.  . furosemide (LASIX) 20 MG tablet TAKE 1 TABLET BY MOUTH ON TUESDAY, THURSDAY, AND SATURDAYS  . glipiZIDE (GLUCOTROL) 5 MG tablet Take 1 tablet (5 mg total) by mouth 2 (two) times daily.  Marland Kitchen PARoxetine (PAXIL) 20 MG tablet Take 1 tablet (20 mg total) by mouth daily.  . pioglitazone (ACTOS) 30 MG tablet Take 1 tablet (30 mg total) by mouth daily.  . potassium chloride SA (KLOR-CON) 20 MEQ tablet TAKE 1 TABLET BY MOUTH ON TUESDAYS, THURSDAYS, AND SATURDAYS  . pravastatin (PRAVACHOL) 10 MG tablet Take 1 tablet (10 mg total) by mouth daily.  Marland Kitchen PROLENSA 0.07 % SOLN Place 1 drop into the left eye daily.     Allergies:   Metformin and related   Social History   Socioeconomic History  . Marital  status: Married    Spouse name: Not on file  . Number of children: Not on file  . Years of education: Not on file  . Highest education level: Not on file  Occupational History  . Not on file  Tobacco Use  . Smoking status: Former Research scientist (life sciences)  . Smokeless tobacco: Former Network engineer and Sexual Activity  . Alcohol use: Not on file  . Drug use: Not on file  . Sexual activity: Not on file  Other Topics Concern  . Not on file  Social History Narrative  . Not on file   Social Determinants of Health   Financial Resource Strain: Not on file  Food Insecurity: Not on file  Transportation Needs: Not on file  Physical  Activity: Not on file  Stress: Not on file  Social Connections: Not on file     Family History: The patient's family history includes Alzheimer's disease in her father; Hyperlipidemia in her sister; Leukemia in her sister; Lung cancer in her sister; Ovarian cancer in her mother.  ROS:   Review of Systems  Constitution: Negative for decreased appetite, fever and weight gain.  HENT: Negative for congestion, ear discharge, hoarse voice and sore throat.   Eyes: Negative for discharge, redness, vision loss in right eye and visual halos.  Cardiovascular: Negative for chest pain, dyspnea on exertion, leg swelling, orthopnea and palpitations.  Respiratory: Negative for cough, hemoptysis, shortness of breath and snoring.   Endocrine: Negative for heat intolerance and polyphagia.  Hematologic/Lymphatic: Negative for bleeding problem. Does not bruise/bleed easily.  Skin: Negative for flushing, nail changes, rash and suspicious lesions.  Musculoskeletal: Negative for arthritis, joint pain, muscle cramps, myalgias, neck pain and stiffness.  Gastrointestinal: Negative for abdominal pain, bowel incontinence, diarrhea and excessive appetite.  Genitourinary: Negative for decreased libido, genital sores and incomplete emptying.  Neurological: Negative for brief paralysis, focal weakness, headaches and loss of balance.  Psychiatric/Behavioral: Negative for altered mental status, depression and suicidal ideas.  Allergic/Immunologic: Negative for HIV exposure and persistent infections.    EKGs/Labs/Other Studies Reviewed:    The following studies were reviewed today:   EKG: None today  Her echocardiogram performed at [facility] on 09/26/2019 reported LVEF of 60 to 65%. Pseudonormal LV filling pattern, with elevated left atrial pressure, mild mitral regurgitation.  Recent Labs: 02/24/2020: BUN 11; Creatinine, Ser 0.93; Magnesium 2.1; Potassium 3.6; Sodium 145  Recent Lipid Panel No results  found for: CHOL, TRIG, HDL, CHOLHDL, VLDL, LDLCALC, LDLDIRECT  Physical Exam:    VS:  BP (!) 152/88   Pulse 66   Ht 5\' 3"  (1.6 m)   Wt 144 lb 12.8 oz (65.7 kg)   SpO2 97%   BMI 25.65 kg/m     Wt Readings from Last 3 Encounters:  11/23/20 144 lb 12.8 oz (65.7 kg)  02/24/20 141 lb (64 kg)  01/20/20 136 lb 12.8 oz (62.1 kg)     GEN: Well nourished, well developed in no acute distress HEENT: Normal NECK: No JVD; No carotid bruits LYMPHATICS: No lymphadenopathy CARDIAC: S1S2 noted,RRR, no murmurs, rubs, gallops RESPIRATORY:  Clear to auscultation without rales, wheezing or rhonchi  ABDOMEN: Soft, non-tender, non-distended, +bowel sounds, no guarding. EXTREMITIES: No edema, No cyanosis, no clubbing MUSCULOSKELETAL:  No deformity  SKIN: Warm and dry NEUROLOGIC:  Alert and oriented x 3, non-focal PSYCHIATRIC:  Normal affect, good insight  ASSESSMENT:    1. PAF (paroxysmal atrial fibrillation) ([place])   2. Hypertension, unspecified type   3. Diabetes mellitus due  to underlying condition with hyperosmolarity without coma, without long-term current use of insulin (HCC)    PLAN:     She is euvolemic we will continue her current diuretics regimen.  In terms of her atrial fibrillation she will remain on her Eliquis 5 mg twice daily and her rate control agents with amiodarone.  She was previously on Cardizem but this medication was stopped due to bradycardia.  Blood pressure slightly elevated today I like to adjust her antihypertensive regimen but the patient has declined as she notes that at home she is usually in the 130s.  No other complaints at this time.  Blood work will be done for [employer] and mag today.  This is being managed by his primary care doctor.  No adjustments for antidiabetic medications were made today.  The patient is in agreement with the above plan. The patient left the office in stable condition.  The patient will follow up in 6 months or sooner if  needed.   Medication Adjustments/Labs and Tests Ordered: Current medicines are reviewed at length with the patient today.  Concerns regarding medicines are outlined above.  Orders Placed This Encounter  Procedures  . Basic Metabolic Panel (BMET)  . Magnesium  . CBC   No orders of the defined types were placed in this encounter.   Patient Instructions  Medication Instructions:  Your physician recommends that you continue on your current medications as directed. Please refer to the Current Medication list given to you today.  *If you need a refill on your cardiac medications before your next appointment, please call your pharmacy*   Lab Work: Your physician recommends that you return for lab work in:   BMET, MAGNESIUM, CBC If you have labs (blood work) drawn today and your tests are completely normal, you will receive your results only by: Marland Kitchen. MyChart Message (if you have MyChart) OR . A paper copy in the mail If you have any lab test that is abnormal or we need to change your treatment, we will call you to review the results.   Testing/Procedures: NONE   Follow-Up: At [facility], you and your health needs are our priority.  As part of our continuing mission to provide you with exceptional heart care, we have created designated Provider Care Teams.  These Care Teams include your primary Cardiologist (physician) and Advanced Practice Providers (APPs -  Physician Assistants and Nurse Practitioners) who all work together to provide you with the care you need, when you need it.  We recommend signing up for the patient portal called "MyChart".  Sign up information is provided on this After Visit Summary.  MyChart is used to connect with patients for Virtual Visits (Telemedicine).  Patients are able to view lab/test results, encounter notes, upcoming appointments, etc.  Non-urgent messages can be sent to your provider as well.   To learn more about what you can do with MyChart, go to  [email or web address].    Your next appointment:   6 month(s)  The format for your next appointment:   In Person  Provider:   Thomasene RippleKardie Murlene Revell, DO   Other Instructions      Adopting a Healthy Lifestyle.  Know what a healthy weight is for you (roughly BMI <25) and aim to maintain this   Aim for 7+ servings of fruits and vegetables daily   65-80+ fluid ounces of water or unsweet tea for healthy kidneys   Limit to max 1 drink of alcohol per day; avoid smoking/tobacco  Limit animal fats in diet for cholesterol and heart health - choose grass fed whenever available   Avoid highly processed foods, and foods high in saturated/trans fats   Aim for low stress - take time to unwind and care for your mental health   Aim for 150 min of moderate intensity exercise weekly for heart health, and weights twice weekly for bone health   Aim for 7-9 hours of sleep daily   When it comes to diets, agreement about the perfect plan isnt easy to find, even among the experts. Experts at the [place] developed an idea known as the Healthy Eating Plate. Just imagine a plate divided into logical, healthy portions.   The emphasis is on diet quality:   Load up on vegetables and fruits - one-half of your plate: Aim for color and variety, and remember that potatoes dont count.   Go for whole grains - one-quarter of your plate: Whole wheat, barley, wheat berries, quinoa, oats, brown rice, and foods made with them. If you want pasta, go with whole wheat pasta.   Protein power - one-quarter of your plate: Fish, chicken, beans, and nuts are all healthy, versatile protein sources. Limit red meat.   The diet, however, does go beyond the plate, offering a few other suggestions.   Use healthy plant oils, such as olive, canola, soy, corn, sunflower and peanut. Check the labels, and avoid partially hydrogenated oil, which have unhealthy trans fats.   If youre thirsty, drink water.  Coffee and tea are good in moderation, but skip sugary drinks and limit milk and dairy products to one or two daily servings.   The type of carbohydrate in the diet is more important than the amount. Some sources of carbohydrates, such as vegetables, fruits, whole grains, and beans-are healthier than others.   Finally, stay active  Signed, Berniece Salines, DO  11/23/2020 4:06 PM    [facility]

## 2020-11-23 NOTE — Patient Instructions (Signed)
Medication Instructions:  Your physician recommends that you continue on your current medications as directed. Please refer to the Current Medication list given to you today.  *If you need a refill on your cardiac medications before your next appointment, please call your pharmacy*   Lab Work: Your physician recommends that you return for lab work in:   BMET, MAGNESIUM, CBC If you have labs (blood work) drawn today and your tests are completely normal, you will receive your results only by: Marland Kitchen MyChart Message (if you have MyChart) OR . A paper copy in the mail If you have any lab test that is abnormal or we need to change your treatment, we will call you to review the results.   Testing/Procedures: NONE   Follow-Up: At [facility], you and your health needs are our priority.  As part of our continuing mission to provide you with exceptional heart care, we have created designated Provider Care Teams.  These Care Teams include your primary Cardiologist (physician) and Advanced Practice Providers (APPs -  Physician Assistants and Nurse Practitioners) who all work together to provide you with the care you need, when you need it.  We recommend signing up for the patient portal called "MyChart".  Sign up information is provided on this After Visit Summary.  MyChart is used to connect with patients for Virtual Visits (Telemedicine).  Patients are able to view lab/test results, encounter notes, upcoming appointments, etc.  Non-urgent messages can be sent to your provider as well.   To learn more about what you can do with MyChart, go to [email or web address].    Your next appointment:   6 month(s)  The format for your next appointment:   In Person  Provider:   Berniece Salines, DO   Other Instructions

## 2020-11-24 LAB — BASIC METABOLIC PANEL
BUN/Creatinine Ratio: 12 (ref 12–28)
BUN: 11 mg/dL (ref 8–27)
CO2: 27 mmol/L (ref 20–29)
Calcium: 8.9 mg/dL (ref 8.7–10.3)
Chloride: 104 mmol/L (ref 96–106)
Creatinine, Ser: 0.94 mg/dL (ref 0.57–1.00)
GFR calc Af Amer: 69 mL/min/{1.73_m2} (ref >59)
GFR calc non Af Amer: 60 mL/min/{1.73_m2} (ref >59)
Glucose: 131 mg/dL — ABNORMAL HIGH (ref 65–99)
Potassium: 3.8 mmol/L (ref 3.5–5.2)
Sodium: 144 mmol/L (ref 134–144)

## 2020-11-24 LAB — CBC
Hematocrit: 41 % (ref 34.0–46.6)
Hemoglobin: 13.3 g/dL (ref 11.1–15.9)
MCH: 30.6 pg (ref 26.6–33.0)
MCHC: 32.4 g/dL (ref 31.5–35.7)
MCV: 95 fL (ref 79–97)
Platelets: 213 10*3/uL (ref 150–450)
RBC: 4.34 x10E6/uL (ref 3.77–5.28)
RDW: 13.4 % (ref 11.7–15.4)
WBC: 8.9 10*3/uL (ref 3.4–10.8)

## 2020-11-24 LAB — MAGNESIUM: Magnesium: 2.1 mg/dL (ref 1.6–2.3)

## 2020-12-23 DIAGNOSIS — Z0000 Encounter for general adult medical examination without abnormal findings: Secondary | ICD-10-CM | POA: Diagnosis not present

## 2020-12-23 DIAGNOSIS — E785 Hyperlipidemia, unspecified: Secondary | ICD-10-CM | POA: Diagnosis not present

## 2020-12-23 DIAGNOSIS — Z79899 Other long term (current) drug therapy: Secondary | ICD-10-CM | POA: Diagnosis not present

## 2020-12-23 DIAGNOSIS — E119 Type 2 diabetes mellitus without complications: Secondary | ICD-10-CM | POA: Diagnosis not present

## 2020-12-23 DIAGNOSIS — I10 Essential (primary) hypertension: Secondary | ICD-10-CM | POA: Diagnosis not present

## 2021-01-06 DIAGNOSIS — E119 Type 2 diabetes mellitus without complications: Secondary | ICD-10-CM | POA: Diagnosis not present

## 2021-04-11 DIAGNOSIS — E119 Type 2 diabetes mellitus without complications: Secondary | ICD-10-CM | POA: Diagnosis not present

## 2021-04-11 DIAGNOSIS — I10 Essential (primary) hypertension: Secondary | ICD-10-CM | POA: Diagnosis not present

## 2021-04-11 DIAGNOSIS — I4891 Unspecified atrial fibrillation: Secondary | ICD-10-CM | POA: Diagnosis not present

## 2021-05-09 DIAGNOSIS — R300 Dysuria: Secondary | ICD-10-CM | POA: Diagnosis not present

## 2021-05-17 DIAGNOSIS — R300 Dysuria: Secondary | ICD-10-CM | POA: Diagnosis not present

## 2021-05-18 DIAGNOSIS — R300 Dysuria: Secondary | ICD-10-CM | POA: Diagnosis not present

## 2021-07-11 ENCOUNTER — Ambulatory Visit: Payer: Medicare Other | Admitting: Cardiology

## 2021-08-24 DIAGNOSIS — F0390 Unspecified dementia without behavioral disturbance: Secondary | ICD-10-CM | POA: Insufficient documentation

## 2021-08-29 ENCOUNTER — Other Ambulatory Visit: Payer: Self-pay | Admitting: Cardiology

## 2021-08-29 NOTE — Progress Notes (Signed)
Cardiology Office Note:    Date:  08/30/2021   ID:  Summer Goodman, DOB October 09, 1946, MRN [id number]  PCP:  Ronita Hipps, MD  Cardiologist:  Shirlee More, MD    Referring MD: Ronita Hipps, MD    ASSESSMENT:    1. PAF (paroxysmal atrial fibrillation) ([place])   2. On amiodarone therapy   3. Hypertension, unspecified type   4. Chronic anticoagulation    PLAN:    In order of problems listed above:  Summer Goodman is doing well maintaining sinus rhythm on low-dose amiodarone without signs of toxicity and anticoagulated.  We will recheck her liver thyroids continue amiodarone.  Continue her anticoagulant with moderate stroke risk Stable BP at target continue current treatment diuretic Continue her anticoagulant She has complaints of oral pain looks like she has glossitis and I asked her to start taking B multivitamin daily Hypertension continue statin as well as in drawing labs abdomen lipid profile today   Next appointment: 9 months   Medication Adjustments/Labs and Tests Ordered: Current medicines are reviewed at length with the patient today.  Concerns regarding medicines are outlined above.  Orders Placed This Encounter  Procedures   Comprehensive metabolic panel   TSH   Lipid panel   EKG 12-Lead   No orders of the defined types were placed in this encounter.   Chief Complaint  Patient presents with   Follow-up   Atrial Fibrillation    History of Present Illness:    Summer Goodman is a 75 y.o. female with a hx of paroxysmal atrial fibrillation maintaining sinus rhythm on amiodarone anticoagulated hypertensive heart disease and hyperlipidemia last seen 11/23/2020.  Compliance with diet, lifestyle and medications: Yes  She has had no clinical recurrence of atrial fibrillation on low-dose amiodarone. She tolerates her anticoagulant without bleeding. No complaints of edema chest pain shortness of breath palpitation or syncope She has had no recurrent  hospitalizations She is on a statin without muscle pain or weakness.  Most recent labs 12/23/2020 cholesterol 173 HDL 40 triglycerides 109 A1c 5.9% hemoglobin 12.6 creatinine 0.8  Her echocardiogram performed at [facility] on 09/26/2019 reported LVEF of 60 to 65%.  Pseudonormal LV filling pattern, with elevated left atrial pressure, mild mitral regurgitation.  EKG 01/20/2020 showed sinus rhythm  Chart review of laboratory test in epic showed no recent CMP or thyroid test. Past Medical History:  Diagnosis Date   Acquired absence of both cervix and uterus 07/11/2012   Acute cystitis without hematuria    Acute respiratory distress    Acute respiratory failure with hypoxia ([place])    AKI (acute kidney injury) ([place]) 09/28/2019   Anxiety 07/11/2012   Anxiety with depression 10/27/2019   Back pain 02/25/2014   Dementia ([place])    Diabetes ([place])    Diabetes mellitus type 2 in nonobese ([place]) 04/18/2016   Edema 07/11/2012   Essential (primary) hypertension 07/11/2012   Gastro-esophageal reflux disease without esophagitis 07/11/2012   Headache, unspecified 07/11/2012   High risk medication use 03/25/2015   Hyperlipidemia    Hyperlipidemia, unspecified 07/11/2012   Hypertension    Hypokalemia 07/11/2012   Osteoporosis    Other long term (current) drug therapy 03/25/2015   Recurrent pleural effusion on left    Restless legs syndrome 02/23/2016   Urinary incontinence 07/11/2012   Weight loss 03/03/2013    Past Surgical History:  Procedure Laterality Date   ABDOMINAL HYSTERECTOMY      Current Medications: Current Meds  Medication Sig   amiodarone (PACERONE) 200  MG tablet Take 1 tablet (200 mg total) by mouth daily. Starting on 12/9, decrease to 1 tablet daily   apixaban (ELIQUIS) 5 MG TABS tablet Take 1 tablet (5 mg total) by mouth 2 (two) times daily.   clonazePAM (KLONOPIN) 2 MG tablet Take 2 mg by mouth 2 (two) times daily as needed for anxiety.   furosemide (LASIX) 20 MG tablet TAKE 1 TABLET  BY MOUTH ON TUESDAY, THURSDAY, AND SATURDAYS   glipiZIDE (GLUCOTROL) 5 MG tablet Take 1 tablet (5 mg total) by mouth 2 (two) times daily. (Patient taking differently: Take 5 mg by mouth daily.)   PARoxetine (PAXIL) 20 MG tablet Take 1 tablet (20 mg total) by mouth daily.   pioglitazone (ACTOS) 30 MG tablet Take 1 tablet (30 mg total) by mouth daily.   potassium chloride SA (KLOR-CON) 20 MEQ tablet TAKE 1 TABLET BY MOUTH ON TUESDAYS, THURSDAYS, AND SATURDAYS   pravastatin (PRAVACHOL) 10 MG tablet Take 1 tablet (10 mg total) by mouth daily. (Patient taking differently: Take 10 mg by mouth daily.)   PROLENSA 0.07 % SOLN Place 1 drop into the left eye daily.     Allergies:   Metformin and related   Social History   Socioeconomic History   Marital status: Married    Spouse name: Not on file   Number of children: Not on file   Years of education: Not on file   Highest education level: Not on file  Occupational History   Not on file  Tobacco Use   Smoking status: Former    Types: Cigars    Passive exposure: Past   Smokeless tobacco: Never  Vaping Use   Vaping Use: Never used  Substance and Sexual Activity   Alcohol use: Not Currently   Drug use: Never   Sexual activity: Not on file  Other Topics Concern   Not on file  Social History Narrative   Not on file   Social Determinants of Health   Financial Resource Strain: Not on file  Food Insecurity: Not on file  Transportation Needs: Not on file  Physical Activity: Not on file  Stress: Not on file  Social Connections: Not on file     Family History: The patient's family history includes Alzheimer's disease in her father; Hyperlipidemia in her sister; Leukemia in her sister; Lung cancer in her sister; Ovarian cancer in her mother. ROS:   Please see the history of present illness.    All other systems reviewed and are negative.  EKGs/Labs/Other Studies Reviewed:    The following studies were reviewed today:  EKG:  EKG  ordered today and personally reviewed.  The ekg ordered today demonstrates sinus rhythm otherwise normal EKG  Recent Labs: 11/23/2020: BUN 11; Creatinine, Ser 0.94; Hemoglobin 13.3; Magnesium 2.1; Platelets 213; Potassium 3.8; Sodium 144  Recent Lipid Panel No results found for: CHOL, TRIG, HDL, CHOLHDL, VLDL, LDLCALC, LDLDIRECT  Physical Exam:    VS:  BP (!) 146/70 (BP Location: Right Arm, Patient Position: Sitting)   Pulse 63   Ht 5\' 3"  (1.6 m)   Wt 143 lb 3.2 oz (65 kg)   SpO2 97%   BMI 25.37 kg/m     Wt Readings from Last 3 Encounters:  08/30/21 143 lb 3.2 oz (65 kg)  11/23/20 144 lb 12.8 oz (65.7 kg)  02/24/20 141 lb (64 kg)     GEN:  Well nourished, well developed in no acute distress HEENT: Normal NECK: No JVD; No carotid bruits LYMPHATICS: No  lymphadenopathy CARDIAC: RRR, no murmurs, rubs, gallops RESPIRATORY:  Clear to auscultation without rales, wheezing or rhonchi  ABDOMEN: Soft, non-tender, non-distended MUSCULOSKELETAL:  No edema; No deformity  SKIN: Warm and dry NEUROLOGIC:  Alert and oriented x 3 PSYCHIATRIC:  Normal affect    Signed, Shirlee More, MD  08/30/2021 4:05 PM    [facility]

## 2021-08-30 ENCOUNTER — Encounter: Payer: Self-pay | Admitting: Cardiology

## 2021-08-30 ENCOUNTER — Other Ambulatory Visit: Payer: Self-pay

## 2021-08-30 ENCOUNTER — Ambulatory Visit: Payer: Medicare Other | Admitting: Cardiology

## 2021-08-30 VITALS — BP 146/70 | HR 63 | Ht 63.0 in | Wt 143.2 lb

## 2021-08-30 DIAGNOSIS — Z79899 Other long term (current) drug therapy: Secondary | ICD-10-CM | POA: Diagnosis not present

## 2021-08-30 DIAGNOSIS — I10 Essential (primary) hypertension: Secondary | ICD-10-CM | POA: Diagnosis not present

## 2021-08-30 DIAGNOSIS — Z7901 Long term (current) use of anticoagulants: Secondary | ICD-10-CM

## 2021-08-30 DIAGNOSIS — I480 Paroxysmal atrial fibrillation: Secondary | ICD-10-CM | POA: Diagnosis not present

## 2021-08-30 NOTE — Patient Instructions (Signed)
Medication Instructions:  Your physician recommends that you continue on your current medications as directed. Please refer to the Current Medication list given to you today.  Please take a Mega B vitamin daily.  *If you need a refill on your cardiac medications before your next appointment, please call your pharmacy*   Lab Work: Your physician recommends that you return for lab work in: [place], TSH, Lipids If you have labs (blood work) drawn today and your tests are completely normal, you will receive your results only by: [place] (if you have MyChart) OR A paper copy in the mail If you have any lab test that is abnormal or we need to change your treatment, we will call you to review the results.   Testing/Procedures: None   Follow-Up: At [facility], you and your health needs are our priority.  As part of our continuing mission to provide you with exceptional heart care, we have created designated Provider Care Teams.  These Care Teams include your primary Cardiologist (physician) and Advanced Practice Providers (APPs -  Physician Assistants and Nurse Practitioners) who all work together to provide you with the care you need, when you need it.  We recommend signing up for the patient portal called "MyChart".  Sign up information is provided on this After Visit Summary.  MyChart is used to connect with patients for Virtual Visits (Telemedicine).  Patients are able to view lab/test results, encounter notes, upcoming appointments, etc.  Non-urgent messages can be sent to your provider as well.   To learn more about what you can do with MyChart, go to [email or web address].    Your next appointment:   9 month(s)  The format for your next appointment:   In Person  Provider:   Shirlee More, MD   Other Instructions

## 2021-08-31 ENCOUNTER — Telehealth: Payer: Self-pay

## 2021-08-31 DIAGNOSIS — E039 Hypothyroidism, unspecified: Secondary | ICD-10-CM

## 2021-08-31 LAB — COMPREHENSIVE METABOLIC PANEL
ALT: 19 IU/L (ref 0–32)
AST: 19 IU/L (ref 0–40)
Albumin/Globulin Ratio: 1.7 (ref 1.2–2.2)
Albumin: 4.1 g/dL (ref 3.7–4.7)
Alkaline Phosphatase: 131 IU/L — ABNORMAL HIGH (ref 44–121)
BUN/Creatinine Ratio: 18 (ref 12–28)
BUN: 19 mg/dL (ref 8–27)
Bilirubin Total: 0.3 mg/dL (ref 0.0–1.2)
CO2: 25 mmol/L (ref 20–29)
Calcium: 9.2 mg/dL (ref 8.7–10.3)
Chloride: 104 mmol/L (ref 96–106)
Creatinine, Ser: 1.04 mg/dL — ABNORMAL HIGH (ref 0.57–1.00)
Globulin, Total: 2.4 g/dL (ref 1.5–4.5)
Glucose: 153 mg/dL — ABNORMAL HIGH (ref 70–99)
Potassium: 4.1 mmol/L (ref 3.5–5.2)
Sodium: 144 mmol/L (ref 134–144)
Total Protein: 6.5 g/dL (ref 6.0–8.5)
eGFR: 56 mL/min/{1.73_m2} — ABNORMAL LOW (ref >59)

## 2021-08-31 LAB — LIPID PANEL
Chol/HDL Ratio: 4.2 ratio (ref 0.0–4.4)
Cholesterol, Total: 183 mg/dL (ref 100–199)
HDL: 44 mg/dL (ref >39)
LDL Chol Calc (NIH): 122 mg/dL — ABNORMAL HIGH (ref 0–99)
Triglycerides: 91 mg/dL (ref 0–149)
VLDL Cholesterol Cal: 17 mg/dL (ref 5–40)

## 2021-08-31 LAB — TSH: TSH: 19.7 u[IU]/mL — ABNORMAL HIGH (ref 0.450–4.500)

## 2021-08-31 MED ORDER — LEVOTHYROXINE SODIUM 50 MCG PO TABS: 50.0000 ug | ORAL_TABLET | Freq: Every day | ORAL | 3 refills | Status: AC

## 2021-08-31 NOTE — Telephone Encounter (Signed)
-----   Message from Richardo Priest, MD sent at 08/31/2021  7:43 AM EDT ----- Hypothyroid Start synthroid 0.500 mg day, one month do TSH and free T3 t4

## 2021-08-31 NOTE — Telephone Encounter (Signed)
Spoke with patient regarding results and recommendation.  Patient verbalizes understanding and is agreeable to plan of care. Advised patient to call back with any issues or concerns.  

## 2021-11-25 DIAGNOSIS — I4891 Unspecified atrial fibrillation: Secondary | ICD-10-CM | POA: Diagnosis not present

## 2021-11-25 DIAGNOSIS — I10 Essential (primary) hypertension: Secondary | ICD-10-CM | POA: Diagnosis not present

## 2021-11-25 DIAGNOSIS — E119 Type 2 diabetes mellitus without complications: Secondary | ICD-10-CM | POA: Diagnosis not present

## 2021-11-25 DIAGNOSIS — Z6827 Body mass index (BMI) 27.0-27.9, adult: Secondary | ICD-10-CM | POA: Diagnosis not present

## 2022-01-12 DIAGNOSIS — Z0000 Encounter for general adult medical examination without abnormal findings: Secondary | ICD-10-CM | POA: Diagnosis not present

## 2022-01-12 DIAGNOSIS — Z6827 Body mass index (BMI) 27.0-27.9, adult: Secondary | ICD-10-CM | POA: Diagnosis not present

## 2022-01-12 DIAGNOSIS — Z1331 Encounter for screening for depression: Secondary | ICD-10-CM | POA: Diagnosis not present

## 2022-01-12 DIAGNOSIS — R109 Unspecified abdominal pain: Secondary | ICD-10-CM | POA: Diagnosis not present

## 2022-01-12 DIAGNOSIS — Z1211 Encounter for screening for malignant neoplasm of colon: Secondary | ICD-10-CM | POA: Diagnosis not present

## 2022-01-18 DIAGNOSIS — J9811 Atelectasis: Secondary | ICD-10-CM | POA: Diagnosis not present

## 2022-01-18 DIAGNOSIS — I700 Atherosclerosis of aorta: Secondary | ICD-10-CM | POA: Diagnosis not present

## 2022-01-18 DIAGNOSIS — K5730 Diverticulosis of large intestine without perforation or abscess without bleeding: Secondary | ICD-10-CM | POA: Diagnosis not present

## 2022-01-18 DIAGNOSIS — D1771 Benign lipomatous neoplasm of kidney: Secondary | ICD-10-CM | POA: Diagnosis not present

## 2022-01-18 DIAGNOSIS — R109 Unspecified abdominal pain: Secondary | ICD-10-CM | POA: Diagnosis not present

## 2022-01-20 DIAGNOSIS — K589 Irritable bowel syndrome without diarrhea: Secondary | ICD-10-CM | POA: Diagnosis not present

## 2022-01-20 DIAGNOSIS — Z1211 Encounter for screening for malignant neoplasm of colon: Secondary | ICD-10-CM | POA: Diagnosis not present

## 2022-02-10 DIAGNOSIS — Z1211 Encounter for screening for malignant neoplasm of colon: Secondary | ICD-10-CM | POA: Diagnosis not present

## 2022-02-10 DIAGNOSIS — K589 Irritable bowel syndrome without diarrhea: Secondary | ICD-10-CM | POA: Diagnosis not present

## 2022-02-13 ENCOUNTER — Other Ambulatory Visit: Payer: Self-pay | Admitting: Cardiology

## 2022-03-22 DIAGNOSIS — K5669 Other intestinal obstruction: Secondary | ICD-10-CM | POA: Diagnosis not present

## 2022-03-22 DIAGNOSIS — K56699 Other intestinal obstruction unspecified as to partial versus complete obstruction: Secondary | ICD-10-CM | POA: Diagnosis not present

## 2022-03-22 DIAGNOSIS — Z1211 Encounter for screening for malignant neoplasm of colon: Secondary | ICD-10-CM | POA: Diagnosis not present

## 2022-04-04 DIAGNOSIS — Z1211 Encounter for screening for malignant neoplasm of colon: Secondary | ICD-10-CM | POA: Diagnosis not present

## 2022-04-04 DIAGNOSIS — K5730 Diverticulosis of large intestine without perforation or abscess without bleeding: Secondary | ICD-10-CM | POA: Diagnosis not present

## 2022-04-14 DIAGNOSIS — E039 Hypothyroidism, unspecified: Secondary | ICD-10-CM | POA: Diagnosis not present

## 2022-04-14 DIAGNOSIS — B952 Enterococcus as the cause of diseases classified elsewhere: Secondary | ICD-10-CM | POA: Diagnosis not present

## 2022-04-14 DIAGNOSIS — I10 Essential (primary) hypertension: Secondary | ICD-10-CM | POA: Diagnosis not present

## 2022-04-14 DIAGNOSIS — W19XXXA Unspecified fall, initial encounter: Secondary | ICD-10-CM | POA: Diagnosis not present

## 2022-04-14 DIAGNOSIS — M1990 Unspecified osteoarthritis, unspecified site: Secondary | ICD-10-CM | POA: Diagnosis not present

## 2022-04-14 DIAGNOSIS — Z7401 Bed confinement status: Secondary | ICD-10-CM | POA: Diagnosis not present

## 2022-04-14 DIAGNOSIS — F32A Depression, unspecified: Secondary | ICD-10-CM | POA: Diagnosis not present

## 2022-04-14 DIAGNOSIS — R918 Other nonspecific abnormal finding of lung field: Secondary | ICD-10-CM | POA: Diagnosis not present

## 2022-04-14 DIAGNOSIS — Z23 Encounter for immunization: Secondary | ICD-10-CM | POA: Diagnosis not present

## 2022-04-14 DIAGNOSIS — S32402D Unspecified fracture of left acetabulum, subsequent encounter for fracture with routine healing: Secondary | ICD-10-CM | POA: Diagnosis not present

## 2022-04-14 DIAGNOSIS — M25552 Pain in left hip: Secondary | ICD-10-CM | POA: Diagnosis not present

## 2022-04-14 DIAGNOSIS — E119 Type 2 diabetes mellitus without complications: Secondary | ICD-10-CM | POA: Diagnosis not present

## 2022-04-14 DIAGNOSIS — M7989 Other specified soft tissue disorders: Secondary | ICD-10-CM | POA: Diagnosis not present

## 2022-04-14 DIAGNOSIS — S32592A Other specified fracture of left pubis, initial encounter for closed fracture: Secondary | ICD-10-CM | POA: Diagnosis not present

## 2022-04-14 DIAGNOSIS — R531 Weakness: Secondary | ICD-10-CM | POA: Diagnosis not present

## 2022-04-14 DIAGNOSIS — N390 Urinary tract infection, site not specified: Secondary | ICD-10-CM | POA: Diagnosis not present

## 2022-04-14 DIAGNOSIS — S32592D Other specified fracture of left pubis, subsequent encounter for fracture with routine healing: Secondary | ICD-10-CM | POA: Diagnosis not present

## 2022-04-14 DIAGNOSIS — M79604 Pain in right leg: Secondary | ICD-10-CM | POA: Diagnosis not present

## 2022-04-14 DIAGNOSIS — N3000 Acute cystitis without hematuria: Secondary | ICD-10-CM | POA: Diagnosis not present

## 2022-04-14 DIAGNOSIS — M25562 Pain in left knee: Secondary | ICD-10-CM | POA: Diagnosis not present

## 2022-04-14 DIAGNOSIS — F419 Anxiety disorder, unspecified: Secondary | ICD-10-CM | POA: Diagnosis not present

## 2022-04-14 DIAGNOSIS — E7800 Pure hypercholesterolemia, unspecified: Secondary | ICD-10-CM | POA: Diagnosis not present

## 2022-04-14 DIAGNOSIS — R609 Edema, unspecified: Secondary | ICD-10-CM | POA: Diagnosis not present

## 2022-04-14 DIAGNOSIS — S32512A Fracture of superior rim of left pubis, initial encounter for closed fracture: Secondary | ICD-10-CM | POA: Diagnosis not present

## 2022-04-14 DIAGNOSIS — R001 Bradycardia, unspecified: Secondary | ICD-10-CM | POA: Diagnosis not present

## 2022-04-15 DIAGNOSIS — M79604 Pain in right leg: Secondary | ICD-10-CM | POA: Diagnosis not present

## 2022-04-15 DIAGNOSIS — M7989 Other specified soft tissue disorders: Secondary | ICD-10-CM | POA: Diagnosis not present

## 2022-04-21 DIAGNOSIS — E7800 Pure hypercholesterolemia, unspecified: Secondary | ICD-10-CM | POA: Diagnosis not present

## 2022-04-21 DIAGNOSIS — S32402D Unspecified fracture of left acetabulum, subsequent encounter for fracture with routine healing: Secondary | ICD-10-CM | POA: Diagnosis not present

## 2022-04-21 DIAGNOSIS — M1990 Unspecified osteoarthritis, unspecified site: Secondary | ICD-10-CM | POA: Diagnosis not present

## 2022-04-21 DIAGNOSIS — S32599A Other specified fracture of unspecified pubis, initial encounter for closed fracture: Secondary | ICD-10-CM | POA: Diagnosis not present

## 2022-04-21 DIAGNOSIS — S32592D Other specified fracture of left pubis, subsequent encounter for fracture with routine healing: Secondary | ICD-10-CM | POA: Diagnosis not present

## 2022-04-21 DIAGNOSIS — R531 Weakness: Secondary | ICD-10-CM | POA: Diagnosis not present

## 2022-04-21 DIAGNOSIS — Z23 Encounter for immunization: Secondary | ICD-10-CM | POA: Diagnosis not present

## 2022-04-21 DIAGNOSIS — N390 Urinary tract infection, site not specified: Secondary | ICD-10-CM | POA: Diagnosis not present

## 2022-04-21 DIAGNOSIS — Z7401 Bed confinement status: Secondary | ICD-10-CM | POA: Diagnosis not present

## 2022-04-21 DIAGNOSIS — S32592A Other specified fracture of left pubis, initial encounter for closed fracture: Secondary | ICD-10-CM | POA: Diagnosis not present

## 2022-04-21 DIAGNOSIS — W19XXXA Unspecified fall, initial encounter: Secondary | ICD-10-CM | POA: Diagnosis not present

## 2022-04-21 DIAGNOSIS — E039 Hypothyroidism, unspecified: Secondary | ICD-10-CM | POA: Diagnosis not present

## 2022-04-21 DIAGNOSIS — F32A Depression, unspecified: Secondary | ICD-10-CM | POA: Diagnosis not present

## 2022-04-21 DIAGNOSIS — E119 Type 2 diabetes mellitus without complications: Secondary | ICD-10-CM | POA: Diagnosis not present

## 2022-04-21 DIAGNOSIS — F419 Anxiety disorder, unspecified: Secondary | ICD-10-CM | POA: Diagnosis not present

## 2022-04-21 DIAGNOSIS — I4891 Unspecified atrial fibrillation: Secondary | ICD-10-CM | POA: Diagnosis not present

## 2022-04-21 DIAGNOSIS — I10 Essential (primary) hypertension: Secondary | ICD-10-CM | POA: Diagnosis not present

## 2022-04-21 DIAGNOSIS — R001 Bradycardia, unspecified: Secondary | ICD-10-CM | POA: Diagnosis not present

## 2022-04-21 DIAGNOSIS — M160 Bilateral primary osteoarthritis of hip: Secondary | ICD-10-CM | POA: Diagnosis not present

## 2022-04-21 DIAGNOSIS — B952 Enterococcus as the cause of diseases classified elsewhere: Secondary | ICD-10-CM | POA: Diagnosis not present

## 2022-04-27 DIAGNOSIS — R531 Weakness: Secondary | ICD-10-CM | POA: Diagnosis not present

## 2022-04-27 DIAGNOSIS — S32599A Other specified fracture of unspecified pubis, initial encounter for closed fracture: Secondary | ICD-10-CM | POA: Diagnosis not present

## 2022-04-27 DIAGNOSIS — I10 Essential (primary) hypertension: Secondary | ICD-10-CM | POA: Diagnosis not present

## 2022-04-27 DIAGNOSIS — R001 Bradycardia, unspecified: Secondary | ICD-10-CM | POA: Diagnosis not present

## 2022-04-27 DIAGNOSIS — N390 Urinary tract infection, site not specified: Secondary | ICD-10-CM | POA: Diagnosis not present

## 2022-04-27 DIAGNOSIS — E119 Type 2 diabetes mellitus without complications: Secondary | ICD-10-CM | POA: Diagnosis not present

## 2022-05-01 DIAGNOSIS — E119 Type 2 diabetes mellitus without complications: Secondary | ICD-10-CM | POA: Diagnosis not present

## 2022-05-01 DIAGNOSIS — W19XXXA Unspecified fall, initial encounter: Secondary | ICD-10-CM | POA: Diagnosis not present

## 2022-05-01 DIAGNOSIS — R001 Bradycardia, unspecified: Secondary | ICD-10-CM | POA: Diagnosis not present

## 2022-05-01 DIAGNOSIS — I10 Essential (primary) hypertension: Secondary | ICD-10-CM | POA: Diagnosis not present

## 2022-05-01 DIAGNOSIS — I4891 Unspecified atrial fibrillation: Secondary | ICD-10-CM | POA: Diagnosis not present

## 2022-05-01 DIAGNOSIS — N390 Urinary tract infection, site not specified: Secondary | ICD-10-CM | POA: Diagnosis not present

## 2022-05-01 DIAGNOSIS — S32592D Other specified fracture of left pubis, subsequent encounter for fracture with routine healing: Secondary | ICD-10-CM | POA: Diagnosis not present

## 2022-05-01 DIAGNOSIS — S32402D Unspecified fracture of left acetabulum, subsequent encounter for fracture with routine healing: Secondary | ICD-10-CM | POA: Diagnosis not present

## 2022-05-01 DIAGNOSIS — R531 Weakness: Secondary | ICD-10-CM | POA: Diagnosis not present

## 2022-05-04 DIAGNOSIS — I10 Essential (primary) hypertension: Secondary | ICD-10-CM | POA: Diagnosis not present

## 2022-05-04 DIAGNOSIS — E119 Type 2 diabetes mellitus without complications: Secondary | ICD-10-CM | POA: Diagnosis not present

## 2022-05-04 DIAGNOSIS — S32402D Unspecified fracture of left acetabulum, subsequent encounter for fracture with routine healing: Secondary | ICD-10-CM | POA: Diagnosis not present

## 2022-05-04 DIAGNOSIS — N390 Urinary tract infection, site not specified: Secondary | ICD-10-CM | POA: Diagnosis not present

## 2022-05-04 DIAGNOSIS — S32592D Other specified fracture of left pubis, subsequent encounter for fracture with routine healing: Secondary | ICD-10-CM | POA: Diagnosis not present

## 2022-05-06 DIAGNOSIS — R001 Bradycardia, unspecified: Secondary | ICD-10-CM | POA: Diagnosis not present

## 2022-05-06 DIAGNOSIS — E119 Type 2 diabetes mellitus without complications: Secondary | ICD-10-CM | POA: Diagnosis not present

## 2022-05-06 DIAGNOSIS — S32599A Other specified fracture of unspecified pubis, initial encounter for closed fracture: Secondary | ICD-10-CM | POA: Diagnosis not present

## 2022-05-06 DIAGNOSIS — I4891 Unspecified atrial fibrillation: Secondary | ICD-10-CM | POA: Diagnosis not present

## 2022-05-06 DIAGNOSIS — I10 Essential (primary) hypertension: Secondary | ICD-10-CM | POA: Diagnosis not present

## 2022-05-06 DIAGNOSIS — R531 Weakness: Secondary | ICD-10-CM | POA: Diagnosis not present

## 2022-05-10 DIAGNOSIS — Z6826 Body mass index (BMI) 26.0-26.9, adult: Secondary | ICD-10-CM | POA: Diagnosis not present

## 2022-05-10 DIAGNOSIS — S32599A Other specified fracture of unspecified pubis, initial encounter for closed fracture: Secondary | ICD-10-CM | POA: Diagnosis not present

## 2022-05-10 DIAGNOSIS — E119 Type 2 diabetes mellitus without complications: Secondary | ICD-10-CM | POA: Diagnosis not present

## 2022-05-10 DIAGNOSIS — M8580 Other specified disorders of bone density and structure, unspecified site: Secondary | ICD-10-CM | POA: Diagnosis not present

## 2022-08-18 ENCOUNTER — Other Ambulatory Visit: Payer: Self-pay | Admitting: Cardiology

## 2022-08-18 NOTE — Telephone Encounter (Signed)
REFILL TO PHARMACY WITH MESSAGE NEEDS APPT FOR FUTURE REFILLS / 1ST ATTEMPT

## 2022-11-10 DIAGNOSIS — J189 Pneumonia, unspecified organism: Secondary | ICD-10-CM | POA: Diagnosis not present

## 2022-11-10 DIAGNOSIS — E872 Acidosis, unspecified: Secondary | ICD-10-CM | POA: Diagnosis not present

## 2022-11-10 DIAGNOSIS — N3001 Acute cystitis with hematuria: Secondary | ICD-10-CM | POA: Diagnosis not present

## 2022-11-10 DIAGNOSIS — R0602 Shortness of breath: Secondary | ICD-10-CM | POA: Diagnosis not present

## 2022-11-10 DIAGNOSIS — E876 Hypokalemia: Secondary | ICD-10-CM | POA: Diagnosis not present

## 2022-11-10 DIAGNOSIS — I10 Essential (primary) hypertension: Secondary | ICD-10-CM | POA: Diagnosis not present

## 2022-11-10 DIAGNOSIS — R231 Pallor: Secondary | ICD-10-CM | POA: Diagnosis not present

## 2022-11-10 DIAGNOSIS — I639 Cerebral infarction, unspecified: Secondary | ICD-10-CM | POA: Diagnosis not present

## 2022-11-10 DIAGNOSIS — E860 Dehydration: Secondary | ICD-10-CM | POA: Diagnosis not present

## 2022-11-10 DIAGNOSIS — R1084 Generalized abdominal pain: Secondary | ICD-10-CM | POA: Diagnosis not present

## 2022-11-10 DIAGNOSIS — R739 Hyperglycemia, unspecified: Secondary | ICD-10-CM | POA: Diagnosis not present

## 2022-11-10 DIAGNOSIS — R059 Cough, unspecified: Secondary | ICD-10-CM | POA: Diagnosis not present

## 2022-11-10 DIAGNOSIS — D72829 Elevated white blood cell count, unspecified: Secondary | ICD-10-CM | POA: Diagnosis not present

## 2022-11-10 DIAGNOSIS — R519 Headache, unspecified: Secondary | ICD-10-CM | POA: Diagnosis not present

## 2022-11-10 DIAGNOSIS — N179 Acute kidney failure, unspecified: Secondary | ICD-10-CM | POA: Diagnosis not present

## 2022-11-10 DIAGNOSIS — B961 Klebsiella pneumoniae [K. pneumoniae] as the cause of diseases classified elsewhere: Secondary | ICD-10-CM | POA: Diagnosis not present

## 2022-11-10 DIAGNOSIS — J9601 Acute respiratory failure with hypoxia: Secondary | ICD-10-CM | POA: Diagnosis not present

## 2022-11-11 DIAGNOSIS — J9601 Acute respiratory failure with hypoxia: Secondary | ICD-10-CM | POA: Diagnosis not present

## 2022-11-11 DIAGNOSIS — N3001 Acute cystitis with hematuria: Secondary | ICD-10-CM | POA: Diagnosis not present

## 2022-11-11 DIAGNOSIS — N179 Acute kidney failure, unspecified: Secondary | ICD-10-CM | POA: Diagnosis not present

## 2022-11-11 DIAGNOSIS — J189 Pneumonia, unspecified organism: Secondary | ICD-10-CM | POA: Diagnosis not present

## 2022-11-12 DIAGNOSIS — J9601 Acute respiratory failure with hypoxia: Secondary | ICD-10-CM | POA: Diagnosis not present

## 2022-11-12 DIAGNOSIS — J189 Pneumonia, unspecified organism: Secondary | ICD-10-CM | POA: Diagnosis not present

## 2022-11-12 DIAGNOSIS — N3001 Acute cystitis with hematuria: Secondary | ICD-10-CM | POA: Diagnosis not present

## 2022-11-13 DIAGNOSIS — J9601 Acute respiratory failure with hypoxia: Secondary | ICD-10-CM | POA: Diagnosis not present

## 2022-11-13 DIAGNOSIS — J189 Pneumonia, unspecified organism: Secondary | ICD-10-CM | POA: Diagnosis not present

## 2022-11-13 DIAGNOSIS — N3001 Acute cystitis with hematuria: Secondary | ICD-10-CM | POA: Diagnosis not present

## 2022-11-14 DIAGNOSIS — N3001 Acute cystitis with hematuria: Secondary | ICD-10-CM | POA: Diagnosis not present

## 2022-11-14 DIAGNOSIS — J9601 Acute respiratory failure with hypoxia: Secondary | ICD-10-CM | POA: Diagnosis not present

## 2022-11-14 DIAGNOSIS — J189 Pneumonia, unspecified organism: Secondary | ICD-10-CM | POA: Diagnosis not present

## 2022-11-17 DIAGNOSIS — B961 Klebsiella pneumoniae [K. pneumoniae] as the cause of diseases classified elsewhere: Secondary | ICD-10-CM | POA: Diagnosis not present

## 2022-11-17 DIAGNOSIS — J9601 Acute respiratory failure with hypoxia: Secondary | ICD-10-CM | POA: Diagnosis not present

## 2022-11-17 DIAGNOSIS — F32A Depression, unspecified: Secondary | ICD-10-CM | POA: Diagnosis not present

## 2022-11-17 DIAGNOSIS — I4891 Unspecified atrial fibrillation: Secondary | ICD-10-CM | POA: Diagnosis not present

## 2022-11-17 DIAGNOSIS — E119 Type 2 diabetes mellitus without complications: Secondary | ICD-10-CM | POA: Diagnosis not present

## 2022-11-17 DIAGNOSIS — N3001 Acute cystitis with hematuria: Secondary | ICD-10-CM | POA: Diagnosis not present

## 2022-11-17 DIAGNOSIS — I10 Essential (primary) hypertension: Secondary | ICD-10-CM | POA: Diagnosis not present

## 2022-11-17 DIAGNOSIS — J189 Pneumonia, unspecified organism: Secondary | ICD-10-CM | POA: Diagnosis not present

## 2022-11-17 DIAGNOSIS — N179 Acute kidney failure, unspecified: Secondary | ICD-10-CM | POA: Diagnosis not present

## 2022-11-24 DIAGNOSIS — B961 Klebsiella pneumoniae [K. pneumoniae] as the cause of diseases classified elsewhere: Secondary | ICD-10-CM | POA: Diagnosis not present

## 2022-11-24 DIAGNOSIS — J189 Pneumonia, unspecified organism: Secondary | ICD-10-CM | POA: Diagnosis not present

## 2022-11-24 DIAGNOSIS — N3001 Acute cystitis with hematuria: Secondary | ICD-10-CM | POA: Diagnosis not present

## 2022-11-30 DIAGNOSIS — F32A Depression, unspecified: Secondary | ICD-10-CM | POA: Diagnosis not present

## 2022-11-30 DIAGNOSIS — J189 Pneumonia, unspecified organism: Secondary | ICD-10-CM | POA: Diagnosis not present

## 2022-11-30 DIAGNOSIS — N3001 Acute cystitis with hematuria: Secondary | ICD-10-CM | POA: Diagnosis not present

## 2022-11-30 DIAGNOSIS — B961 Klebsiella pneumoniae [K. pneumoniae] as the cause of diseases classified elsewhere: Secondary | ICD-10-CM | POA: Diagnosis not present

## 2022-11-30 DIAGNOSIS — N179 Acute kidney failure, unspecified: Secondary | ICD-10-CM | POA: Diagnosis not present

## 2022-11-30 DIAGNOSIS — I10 Essential (primary) hypertension: Secondary | ICD-10-CM | POA: Diagnosis not present

## 2022-11-30 DIAGNOSIS — J9601 Acute respiratory failure with hypoxia: Secondary | ICD-10-CM | POA: Diagnosis not present

## 2022-11-30 DIAGNOSIS — I4891 Unspecified atrial fibrillation: Secondary | ICD-10-CM | POA: Diagnosis not present

## 2022-11-30 DIAGNOSIS — E119 Type 2 diabetes mellitus without complications: Secondary | ICD-10-CM | POA: Diagnosis not present

## 2022-12-01 DIAGNOSIS — F32A Depression, unspecified: Secondary | ICD-10-CM | POA: Diagnosis not present

## 2022-12-01 DIAGNOSIS — I10 Essential (primary) hypertension: Secondary | ICD-10-CM | POA: Diagnosis not present

## 2022-12-01 DIAGNOSIS — B961 Klebsiella pneumoniae [K. pneumoniae] as the cause of diseases classified elsewhere: Secondary | ICD-10-CM | POA: Diagnosis not present

## 2022-12-01 DIAGNOSIS — J189 Pneumonia, unspecified organism: Secondary | ICD-10-CM | POA: Diagnosis not present

## 2022-12-01 DIAGNOSIS — N179 Acute kidney failure, unspecified: Secondary | ICD-10-CM | POA: Diagnosis not present

## 2022-12-01 DIAGNOSIS — N3001 Acute cystitis with hematuria: Secondary | ICD-10-CM | POA: Diagnosis not present

## 2022-12-01 DIAGNOSIS — I4891 Unspecified atrial fibrillation: Secondary | ICD-10-CM | POA: Diagnosis not present

## 2022-12-01 DIAGNOSIS — E119 Type 2 diabetes mellitus without complications: Secondary | ICD-10-CM | POA: Diagnosis not present

## 2022-12-01 DIAGNOSIS — J9601 Acute respiratory failure with hypoxia: Secondary | ICD-10-CM | POA: Diagnosis not present

## 2022-12-07 DIAGNOSIS — J9601 Acute respiratory failure with hypoxia: Secondary | ICD-10-CM | POA: Diagnosis not present

## 2022-12-07 DIAGNOSIS — N179 Acute kidney failure, unspecified: Secondary | ICD-10-CM | POA: Diagnosis not present

## 2022-12-07 DIAGNOSIS — B961 Klebsiella pneumoniae [K. pneumoniae] as the cause of diseases classified elsewhere: Secondary | ICD-10-CM | POA: Diagnosis not present

## 2022-12-07 DIAGNOSIS — I4891 Unspecified atrial fibrillation: Secondary | ICD-10-CM | POA: Diagnosis not present

## 2022-12-07 DIAGNOSIS — E119 Type 2 diabetes mellitus without complications: Secondary | ICD-10-CM | POA: Diagnosis not present

## 2022-12-07 DIAGNOSIS — N3001 Acute cystitis with hematuria: Secondary | ICD-10-CM | POA: Diagnosis not present

## 2022-12-07 DIAGNOSIS — F32A Depression, unspecified: Secondary | ICD-10-CM | POA: Diagnosis not present

## 2022-12-07 DIAGNOSIS — J189 Pneumonia, unspecified organism: Secondary | ICD-10-CM | POA: Diagnosis not present

## 2022-12-07 DIAGNOSIS — I10 Essential (primary) hypertension: Secondary | ICD-10-CM | POA: Diagnosis not present

## 2022-12-11 DIAGNOSIS — F32A Depression, unspecified: Secondary | ICD-10-CM | POA: Diagnosis not present

## 2022-12-11 DIAGNOSIS — I10 Essential (primary) hypertension: Secondary | ICD-10-CM | POA: Diagnosis not present

## 2022-12-11 DIAGNOSIS — N179 Acute kidney failure, unspecified: Secondary | ICD-10-CM | POA: Diagnosis not present

## 2022-12-11 DIAGNOSIS — J9601 Acute respiratory failure with hypoxia: Secondary | ICD-10-CM | POA: Diagnosis not present

## 2022-12-11 DIAGNOSIS — J189 Pneumonia, unspecified organism: Secondary | ICD-10-CM | POA: Diagnosis not present

## 2022-12-11 DIAGNOSIS — I4891 Unspecified atrial fibrillation: Secondary | ICD-10-CM | POA: Diagnosis not present

## 2022-12-11 DIAGNOSIS — E119 Type 2 diabetes mellitus without complications: Secondary | ICD-10-CM | POA: Diagnosis not present

## 2022-12-11 DIAGNOSIS — B961 Klebsiella pneumoniae [K. pneumoniae] as the cause of diseases classified elsewhere: Secondary | ICD-10-CM | POA: Diagnosis not present

## 2022-12-11 DIAGNOSIS — N3001 Acute cystitis with hematuria: Secondary | ICD-10-CM | POA: Diagnosis not present

## 2022-12-17 DIAGNOSIS — I4891 Unspecified atrial fibrillation: Secondary | ICD-10-CM | POA: Diagnosis not present

## 2022-12-17 DIAGNOSIS — B961 Klebsiella pneumoniae [K. pneumoniae] as the cause of diseases classified elsewhere: Secondary | ICD-10-CM | POA: Diagnosis not present

## 2022-12-17 DIAGNOSIS — N3001 Acute cystitis with hematuria: Secondary | ICD-10-CM | POA: Diagnosis not present

## 2022-12-17 DIAGNOSIS — I10 Essential (primary) hypertension: Secondary | ICD-10-CM | POA: Diagnosis not present

## 2022-12-17 DIAGNOSIS — E119 Type 2 diabetes mellitus without complications: Secondary | ICD-10-CM | POA: Diagnosis not present

## 2022-12-17 DIAGNOSIS — J189 Pneumonia, unspecified organism: Secondary | ICD-10-CM | POA: Diagnosis not present

## 2022-12-17 DIAGNOSIS — J9601 Acute respiratory failure with hypoxia: Secondary | ICD-10-CM | POA: Diagnosis not present

## 2022-12-17 DIAGNOSIS — F32A Depression, unspecified: Secondary | ICD-10-CM | POA: Diagnosis not present

## 2022-12-17 DIAGNOSIS — N179 Acute kidney failure, unspecified: Secondary | ICD-10-CM | POA: Diagnosis not present

## 2022-12-21 DIAGNOSIS — B961 Klebsiella pneumoniae [K. pneumoniae] as the cause of diseases classified elsewhere: Secondary | ICD-10-CM | POA: Diagnosis not present

## 2022-12-21 DIAGNOSIS — E119 Type 2 diabetes mellitus without complications: Secondary | ICD-10-CM | POA: Diagnosis not present

## 2022-12-21 DIAGNOSIS — I10 Essential (primary) hypertension: Secondary | ICD-10-CM | POA: Diagnosis not present

## 2022-12-21 DIAGNOSIS — N179 Acute kidney failure, unspecified: Secondary | ICD-10-CM | POA: Diagnosis not present

## 2022-12-21 DIAGNOSIS — I4891 Unspecified atrial fibrillation: Secondary | ICD-10-CM | POA: Diagnosis not present

## 2022-12-21 DIAGNOSIS — J189 Pneumonia, unspecified organism: Secondary | ICD-10-CM | POA: Diagnosis not present

## 2022-12-21 DIAGNOSIS — N3001 Acute cystitis with hematuria: Secondary | ICD-10-CM | POA: Diagnosis not present

## 2022-12-21 DIAGNOSIS — J9601 Acute respiratory failure with hypoxia: Secondary | ICD-10-CM | POA: Diagnosis not present

## 2022-12-21 DIAGNOSIS — F32A Depression, unspecified: Secondary | ICD-10-CM | POA: Diagnosis not present

## 2023-01-11 DIAGNOSIS — N179 Acute kidney failure, unspecified: Secondary | ICD-10-CM | POA: Diagnosis not present

## 2023-01-11 DIAGNOSIS — I4891 Unspecified atrial fibrillation: Secondary | ICD-10-CM | POA: Diagnosis not present

## 2023-01-11 DIAGNOSIS — I10 Essential (primary) hypertension: Secondary | ICD-10-CM | POA: Diagnosis not present

## 2023-01-11 DIAGNOSIS — J9601 Acute respiratory failure with hypoxia: Secondary | ICD-10-CM | POA: Diagnosis not present

## 2023-01-11 DIAGNOSIS — B961 Klebsiella pneumoniae [K. pneumoniae] as the cause of diseases classified elsewhere: Secondary | ICD-10-CM | POA: Diagnosis not present

## 2023-01-11 DIAGNOSIS — E119 Type 2 diabetes mellitus without complications: Secondary | ICD-10-CM | POA: Diagnosis not present

## 2023-01-11 DIAGNOSIS — N3001 Acute cystitis with hematuria: Secondary | ICD-10-CM | POA: Diagnosis not present

## 2023-01-11 DIAGNOSIS — F32A Depression, unspecified: Secondary | ICD-10-CM | POA: Diagnosis not present

## 2023-01-11 DIAGNOSIS — J189 Pneumonia, unspecified organism: Secondary | ICD-10-CM | POA: Diagnosis not present

## 2023-01-17 DIAGNOSIS — Z6827 Body mass index (BMI) 27.0-27.9, adult: Secondary | ICD-10-CM | POA: Diagnosis not present

## 2023-01-17 DIAGNOSIS — I4891 Unspecified atrial fibrillation: Secondary | ICD-10-CM | POA: Diagnosis not present

## 2023-01-17 DIAGNOSIS — Z0000 Encounter for general adult medical examination without abnormal findings: Secondary | ICD-10-CM | POA: Diagnosis not present

## 2023-01-17 DIAGNOSIS — Z79899 Other long term (current) drug therapy: Secondary | ICD-10-CM | POA: Diagnosis not present

## 2023-01-17 DIAGNOSIS — F0390 Unspecified dementia without behavioral disturbance: Secondary | ICD-10-CM | POA: Diagnosis not present

## 2023-01-17 DIAGNOSIS — E039 Hypothyroidism, unspecified: Secondary | ICD-10-CM | POA: Diagnosis not present

## 2023-01-17 DIAGNOSIS — E119 Type 2 diabetes mellitus without complications: Secondary | ICD-10-CM | POA: Diagnosis not present

## 2023-01-17 DIAGNOSIS — M8580 Other specified disorders of bone density and structure, unspecified site: Secondary | ICD-10-CM | POA: Diagnosis not present

## 2023-01-17 DIAGNOSIS — Z1331 Encounter for screening for depression: Secondary | ICD-10-CM | POA: Diagnosis not present

## 2023-04-19 DIAGNOSIS — I4891 Unspecified atrial fibrillation: Secondary | ICD-10-CM | POA: Diagnosis not present

## 2023-04-19 DIAGNOSIS — Z6827 Body mass index (BMI) 27.0-27.9, adult: Secondary | ICD-10-CM | POA: Diagnosis not present

## 2023-04-19 DIAGNOSIS — E039 Hypothyroidism, unspecified: Secondary | ICD-10-CM | POA: Diagnosis not present

## 2023-04-19 DIAGNOSIS — E119 Type 2 diabetes mellitus without complications: Secondary | ICD-10-CM | POA: Diagnosis not present

## 2023-06-20 DIAGNOSIS — F0390 Unspecified dementia without behavioral disturbance: Secondary | ICD-10-CM | POA: Diagnosis not present

## 2023-06-20 DIAGNOSIS — I10 Essential (primary) hypertension: Secondary | ICD-10-CM | POA: Diagnosis not present

## 2023-06-20 DIAGNOSIS — R195 Other fecal abnormalities: Secondary | ICD-10-CM | POA: Diagnosis not present

## 2023-06-20 DIAGNOSIS — D649 Anemia, unspecified: Secondary | ICD-10-CM | POA: Diagnosis not present

## 2023-06-20 DIAGNOSIS — N3001 Acute cystitis with hematuria: Secondary | ICD-10-CM | POA: Diagnosis not present

## 2023-06-20 DIAGNOSIS — R739 Hyperglycemia, unspecified: Secondary | ICD-10-CM | POA: Diagnosis not present

## 2023-06-20 DIAGNOSIS — E876 Hypokalemia: Secondary | ICD-10-CM | POA: Diagnosis not present

## 2023-06-20 DIAGNOSIS — E1065 Type 1 diabetes mellitus with hyperglycemia: Secondary | ICD-10-CM | POA: Diagnosis not present

## 2023-06-20 DIAGNOSIS — R531 Weakness: Secondary | ICD-10-CM | POA: Diagnosis not present

## 2023-06-20 DIAGNOSIS — I700 Atherosclerosis of aorta: Secondary | ICD-10-CM | POA: Diagnosis not present

## 2023-06-20 DIAGNOSIS — Z79899 Other long term (current) drug therapy: Secondary | ICD-10-CM | POA: Diagnosis not present

## 2023-06-20 DIAGNOSIS — R002 Palpitations: Secondary | ICD-10-CM | POA: Diagnosis not present

## 2023-06-20 DIAGNOSIS — Z7901 Long term (current) use of anticoagulants: Secondary | ICD-10-CM | POA: Diagnosis not present

## 2023-06-21 DIAGNOSIS — E1065 Type 1 diabetes mellitus with hyperglycemia: Secondary | ICD-10-CM | POA: Diagnosis not present

## 2023-06-21 DIAGNOSIS — R739 Hyperglycemia, unspecified: Secondary | ICD-10-CM | POA: Diagnosis not present

## 2023-06-21 DIAGNOSIS — R195 Other fecal abnormalities: Secondary | ICD-10-CM | POA: Diagnosis not present

## 2023-06-21 DIAGNOSIS — N3001 Acute cystitis with hematuria: Secondary | ICD-10-CM | POA: Diagnosis not present

## 2023-06-25 DIAGNOSIS — I700 Atherosclerosis of aorta: Secondary | ICD-10-CM | POA: Diagnosis not present

## 2023-06-25 DIAGNOSIS — E860 Dehydration: Secondary | ICD-10-CM | POA: Diagnosis not present

## 2023-06-25 DIAGNOSIS — E039 Hypothyroidism, unspecified: Secondary | ICD-10-CM | POA: Diagnosis not present

## 2023-06-25 DIAGNOSIS — E1065 Type 1 diabetes mellitus with hyperglycemia: Secondary | ICD-10-CM | POA: Diagnosis not present

## 2023-06-25 DIAGNOSIS — E876 Hypokalemia: Secondary | ICD-10-CM | POA: Diagnosis not present

## 2023-06-25 DIAGNOSIS — F0280 Dementia in other diseases classified elsewhere without behavioral disturbance: Secondary | ICD-10-CM | POA: Diagnosis not present

## 2023-06-25 DIAGNOSIS — I4891 Unspecified atrial fibrillation: Secondary | ICD-10-CM | POA: Diagnosis not present

## 2023-06-25 DIAGNOSIS — N3001 Acute cystitis with hematuria: Secondary | ICD-10-CM | POA: Diagnosis not present

## 2023-06-25 DIAGNOSIS — E1165 Type 2 diabetes mellitus with hyperglycemia: Secondary | ICD-10-CM | POA: Diagnosis not present

## 2023-06-25 DIAGNOSIS — I10 Essential (primary) hypertension: Secondary | ICD-10-CM | POA: Diagnosis not present

## 2023-06-29 DIAGNOSIS — E860 Dehydration: Secondary | ICD-10-CM | POA: Diagnosis not present

## 2023-06-29 DIAGNOSIS — I700 Atherosclerosis of aorta: Secondary | ICD-10-CM | POA: Diagnosis not present

## 2023-06-29 DIAGNOSIS — I4891 Unspecified atrial fibrillation: Secondary | ICD-10-CM | POA: Diagnosis not present

## 2023-06-29 DIAGNOSIS — E876 Hypokalemia: Secondary | ICD-10-CM | POA: Diagnosis not present

## 2023-06-29 DIAGNOSIS — E1165 Type 2 diabetes mellitus with hyperglycemia: Secondary | ICD-10-CM | POA: Diagnosis not present

## 2023-06-29 DIAGNOSIS — I10 Essential (primary) hypertension: Secondary | ICD-10-CM | POA: Diagnosis not present

## 2023-06-29 DIAGNOSIS — E039 Hypothyroidism, unspecified: Secondary | ICD-10-CM | POA: Diagnosis not present

## 2023-06-29 DIAGNOSIS — F0280 Dementia in other diseases classified elsewhere without behavioral disturbance: Secondary | ICD-10-CM | POA: Diagnosis not present

## 2023-06-29 DIAGNOSIS — N3001 Acute cystitis with hematuria: Secondary | ICD-10-CM | POA: Diagnosis not present

## 2023-06-30 DIAGNOSIS — F17210 Nicotine dependence, cigarettes, uncomplicated: Secondary | ICD-10-CM | POA: Diagnosis not present

## 2023-06-30 DIAGNOSIS — E785 Hyperlipidemia, unspecified: Secondary | ICD-10-CM | POA: Diagnosis not present

## 2023-06-30 DIAGNOSIS — K449 Diaphragmatic hernia without obstruction or gangrene: Secondary | ICD-10-CM | POA: Diagnosis not present

## 2023-06-30 DIAGNOSIS — I10 Essential (primary) hypertension: Secondary | ICD-10-CM | POA: Diagnosis not present

## 2023-06-30 DIAGNOSIS — Q600 Renal agenesis, unilateral: Secondary | ICD-10-CM | POA: Diagnosis not present

## 2023-06-30 DIAGNOSIS — K31A11 Gastric intestinal metaplasia without dysplasia, involving the antrum: Secondary | ICD-10-CM | POA: Diagnosis not present

## 2023-06-30 DIAGNOSIS — E161 Other hypoglycemia: Secondary | ICD-10-CM | POA: Diagnosis not present

## 2023-06-30 DIAGNOSIS — K921 Melena: Secondary | ICD-10-CM | POA: Diagnosis not present

## 2023-06-30 DIAGNOSIS — D649 Anemia, unspecified: Secondary | ICD-10-CM | POA: Diagnosis not present

## 2023-06-30 DIAGNOSIS — I701 Atherosclerosis of renal artery: Secondary | ICD-10-CM | POA: Diagnosis not present

## 2023-06-30 DIAGNOSIS — I7143 Infrarenal abdominal aortic aneurysm, without rupture: Secondary | ICD-10-CM | POA: Diagnosis not present

## 2023-06-30 DIAGNOSIS — D123 Benign neoplasm of transverse colon: Secondary | ICD-10-CM | POA: Diagnosis not present

## 2023-06-30 DIAGNOSIS — K5730 Diverticulosis of large intestine without perforation or abscess without bleeding: Secondary | ICD-10-CM | POA: Diagnosis not present

## 2023-06-30 DIAGNOSIS — E1169 Type 2 diabetes mellitus with other specified complication: Secondary | ICD-10-CM | POA: Diagnosis not present

## 2023-06-30 DIAGNOSIS — Z7901 Long term (current) use of anticoagulants: Secondary | ICD-10-CM | POA: Diagnosis not present

## 2023-06-30 DIAGNOSIS — K5792 Diverticulitis of intestine, part unspecified, without perforation or abscess without bleeding: Secondary | ICD-10-CM | POA: Diagnosis not present

## 2023-06-30 DIAGNOSIS — I480 Paroxysmal atrial fibrillation: Secondary | ICD-10-CM | POA: Diagnosis not present

## 2023-06-30 DIAGNOSIS — K648 Other hemorrhoids: Secondary | ICD-10-CM | POA: Diagnosis not present

## 2023-06-30 DIAGNOSIS — R197 Diarrhea, unspecified: Secondary | ICD-10-CM | POA: Diagnosis not present

## 2023-07-01 DIAGNOSIS — D500 Iron deficiency anemia secondary to blood loss (chronic): Secondary | ICD-10-CM | POA: Diagnosis not present

## 2023-07-01 DIAGNOSIS — K921 Melena: Secondary | ICD-10-CM | POA: Diagnosis not present

## 2023-07-02 DIAGNOSIS — K921 Melena: Secondary | ICD-10-CM | POA: Diagnosis not present

## 2023-07-02 DIAGNOSIS — R9431 Abnormal electrocardiogram [ECG] [EKG]: Secondary | ICD-10-CM | POA: Diagnosis not present

## 2023-07-03 DIAGNOSIS — K921 Melena: Secondary | ICD-10-CM | POA: Diagnosis not present

## 2023-07-04 DIAGNOSIS — K5730 Diverticulosis of large intestine without perforation or abscess without bleeding: Secondary | ICD-10-CM | POA: Diagnosis not present

## 2023-07-04 DIAGNOSIS — K648 Other hemorrhoids: Secondary | ICD-10-CM | POA: Diagnosis not present

## 2023-07-04 DIAGNOSIS — K3189 Other diseases of stomach and duodenum: Secondary | ICD-10-CM | POA: Diagnosis not present

## 2023-07-04 DIAGNOSIS — K635 Polyp of colon: Secondary | ICD-10-CM | POA: Diagnosis not present

## 2023-07-04 DIAGNOSIS — K2950 Unspecified chronic gastritis without bleeding: Secondary | ICD-10-CM | POA: Diagnosis not present

## 2023-07-04 DIAGNOSIS — D123 Benign neoplasm of transverse colon: Secondary | ICD-10-CM | POA: Diagnosis not present

## 2023-07-04 DIAGNOSIS — K921 Melena: Secondary | ICD-10-CM | POA: Diagnosis not present

## 2023-07-04 DIAGNOSIS — K449 Diaphragmatic hernia without obstruction or gangrene: Secondary | ICD-10-CM | POA: Diagnosis not present

## 2023-07-05 DIAGNOSIS — K921 Melena: Secondary | ICD-10-CM | POA: Diagnosis not present

## 2023-07-06 DIAGNOSIS — E1165 Type 2 diabetes mellitus with hyperglycemia: Secondary | ICD-10-CM | POA: Diagnosis not present

## 2023-07-06 DIAGNOSIS — E876 Hypokalemia: Secondary | ICD-10-CM | POA: Diagnosis not present

## 2023-07-06 DIAGNOSIS — I10 Essential (primary) hypertension: Secondary | ICD-10-CM | POA: Diagnosis not present

## 2023-07-06 DIAGNOSIS — E860 Dehydration: Secondary | ICD-10-CM | POA: Diagnosis not present

## 2023-07-06 DIAGNOSIS — I4891 Unspecified atrial fibrillation: Secondary | ICD-10-CM | POA: Diagnosis not present

## 2023-07-06 DIAGNOSIS — I700 Atherosclerosis of aorta: Secondary | ICD-10-CM | POA: Diagnosis not present

## 2023-07-06 DIAGNOSIS — F0280 Dementia in other diseases classified elsewhere without behavioral disturbance: Secondary | ICD-10-CM | POA: Diagnosis not present

## 2023-07-06 DIAGNOSIS — E039 Hypothyroidism, unspecified: Secondary | ICD-10-CM | POA: Diagnosis not present

## 2023-07-06 DIAGNOSIS — N3001 Acute cystitis with hematuria: Secondary | ICD-10-CM | POA: Diagnosis not present

## 2023-07-08 DIAGNOSIS — I480 Paroxysmal atrial fibrillation: Secondary | ICD-10-CM | POA: Diagnosis not present

## 2023-07-08 DIAGNOSIS — D62 Acute posthemorrhagic anemia: Secondary | ICD-10-CM | POA: Diagnosis not present

## 2023-07-09 DIAGNOSIS — E1165 Type 2 diabetes mellitus with hyperglycemia: Secondary | ICD-10-CM | POA: Diagnosis not present

## 2023-07-09 DIAGNOSIS — N3001 Acute cystitis with hematuria: Secondary | ICD-10-CM | POA: Diagnosis not present

## 2023-07-09 DIAGNOSIS — E860 Dehydration: Secondary | ICD-10-CM | POA: Diagnosis not present

## 2023-07-09 DIAGNOSIS — E039 Hypothyroidism, unspecified: Secondary | ICD-10-CM | POA: Diagnosis not present

## 2023-07-09 DIAGNOSIS — I700 Atherosclerosis of aorta: Secondary | ICD-10-CM | POA: Diagnosis not present

## 2023-07-09 DIAGNOSIS — I4891 Unspecified atrial fibrillation: Secondary | ICD-10-CM | POA: Diagnosis not present

## 2023-07-09 DIAGNOSIS — F0280 Dementia in other diseases classified elsewhere without behavioral disturbance: Secondary | ICD-10-CM | POA: Diagnosis not present

## 2023-07-09 DIAGNOSIS — E876 Hypokalemia: Secondary | ICD-10-CM | POA: Diagnosis not present

## 2023-07-09 DIAGNOSIS — I10 Essential (primary) hypertension: Secondary | ICD-10-CM | POA: Diagnosis not present

## 2023-07-10 DIAGNOSIS — N3001 Acute cystitis with hematuria: Secondary | ICD-10-CM | POA: Diagnosis not present

## 2023-07-10 DIAGNOSIS — I4891 Unspecified atrial fibrillation: Secondary | ICD-10-CM | POA: Diagnosis not present

## 2023-07-10 DIAGNOSIS — E1165 Type 2 diabetes mellitus with hyperglycemia: Secondary | ICD-10-CM | POA: Diagnosis not present

## 2023-07-10 DIAGNOSIS — E860 Dehydration: Secondary | ICD-10-CM | POA: Diagnosis not present

## 2023-07-10 DIAGNOSIS — F0280 Dementia in other diseases classified elsewhere without behavioral disturbance: Secondary | ICD-10-CM | POA: Diagnosis not present

## 2023-07-10 DIAGNOSIS — I10 Essential (primary) hypertension: Secondary | ICD-10-CM | POA: Diagnosis not present

## 2023-07-10 DIAGNOSIS — E039 Hypothyroidism, unspecified: Secondary | ICD-10-CM | POA: Diagnosis not present

## 2023-07-10 DIAGNOSIS — E876 Hypokalemia: Secondary | ICD-10-CM | POA: Diagnosis not present

## 2023-07-10 DIAGNOSIS — I700 Atherosclerosis of aorta: Secondary | ICD-10-CM | POA: Diagnosis not present

## 2023-07-17 DIAGNOSIS — E860 Dehydration: Secondary | ICD-10-CM | POA: Diagnosis not present

## 2023-07-17 DIAGNOSIS — I10 Essential (primary) hypertension: Secondary | ICD-10-CM | POA: Diagnosis not present

## 2023-07-17 DIAGNOSIS — F0280 Dementia in other diseases classified elsewhere without behavioral disturbance: Secondary | ICD-10-CM | POA: Diagnosis not present

## 2023-07-17 DIAGNOSIS — E039 Hypothyroidism, unspecified: Secondary | ICD-10-CM | POA: Diagnosis not present

## 2023-07-17 DIAGNOSIS — E1165 Type 2 diabetes mellitus with hyperglycemia: Secondary | ICD-10-CM | POA: Diagnosis not present

## 2023-07-17 DIAGNOSIS — I700 Atherosclerosis of aorta: Secondary | ICD-10-CM | POA: Diagnosis not present

## 2023-07-17 DIAGNOSIS — I4891 Unspecified atrial fibrillation: Secondary | ICD-10-CM | POA: Diagnosis not present

## 2023-07-17 DIAGNOSIS — E876 Hypokalemia: Secondary | ICD-10-CM | POA: Diagnosis not present

## 2023-07-17 DIAGNOSIS — N3001 Acute cystitis with hematuria: Secondary | ICD-10-CM | POA: Diagnosis not present

## 2023-07-18 DIAGNOSIS — I700 Atherosclerosis of aorta: Secondary | ICD-10-CM | POA: Diagnosis not present

## 2023-07-18 DIAGNOSIS — I4891 Unspecified atrial fibrillation: Secondary | ICD-10-CM | POA: Diagnosis not present

## 2023-07-18 DIAGNOSIS — E860 Dehydration: Secondary | ICD-10-CM | POA: Diagnosis not present

## 2023-07-18 DIAGNOSIS — I10 Essential (primary) hypertension: Secondary | ICD-10-CM | POA: Diagnosis not present

## 2023-07-18 DIAGNOSIS — E1165 Type 2 diabetes mellitus with hyperglycemia: Secondary | ICD-10-CM | POA: Diagnosis not present

## 2023-07-18 DIAGNOSIS — F0280 Dementia in other diseases classified elsewhere without behavioral disturbance: Secondary | ICD-10-CM | POA: Diagnosis not present

## 2023-07-18 DIAGNOSIS — E876 Hypokalemia: Secondary | ICD-10-CM | POA: Diagnosis not present

## 2023-07-18 DIAGNOSIS — E039 Hypothyroidism, unspecified: Secondary | ICD-10-CM | POA: Diagnosis not present

## 2023-07-18 DIAGNOSIS — N3001 Acute cystitis with hematuria: Secondary | ICD-10-CM | POA: Diagnosis not present

## 2023-07-20 DIAGNOSIS — E039 Hypothyroidism, unspecified: Secondary | ICD-10-CM | POA: Diagnosis not present

## 2023-07-20 DIAGNOSIS — I10 Essential (primary) hypertension: Secondary | ICD-10-CM | POA: Diagnosis not present

## 2023-07-20 DIAGNOSIS — E1165 Type 2 diabetes mellitus with hyperglycemia: Secondary | ICD-10-CM | POA: Diagnosis not present

## 2023-07-20 DIAGNOSIS — I700 Atherosclerosis of aorta: Secondary | ICD-10-CM | POA: Diagnosis not present

## 2023-07-20 DIAGNOSIS — E860 Dehydration: Secondary | ICD-10-CM | POA: Diagnosis not present

## 2023-07-20 DIAGNOSIS — N3001 Acute cystitis with hematuria: Secondary | ICD-10-CM | POA: Diagnosis not present

## 2023-07-20 DIAGNOSIS — E876 Hypokalemia: Secondary | ICD-10-CM | POA: Diagnosis not present

## 2023-07-20 DIAGNOSIS — F0280 Dementia in other diseases classified elsewhere without behavioral disturbance: Secondary | ICD-10-CM | POA: Diagnosis not present

## 2023-07-20 DIAGNOSIS — I4891 Unspecified atrial fibrillation: Secondary | ICD-10-CM | POA: Diagnosis not present

## 2023-07-23 DIAGNOSIS — F0280 Dementia in other diseases classified elsewhere without behavioral disturbance: Secondary | ICD-10-CM | POA: Diagnosis not present

## 2023-07-23 DIAGNOSIS — I700 Atherosclerosis of aorta: Secondary | ICD-10-CM | POA: Diagnosis not present

## 2023-07-23 DIAGNOSIS — E1165 Type 2 diabetes mellitus with hyperglycemia: Secondary | ICD-10-CM | POA: Diagnosis not present

## 2023-07-23 DIAGNOSIS — I10 Essential (primary) hypertension: Secondary | ICD-10-CM | POA: Diagnosis not present

## 2023-07-23 DIAGNOSIS — E039 Hypothyroidism, unspecified: Secondary | ICD-10-CM | POA: Diagnosis not present

## 2023-07-23 DIAGNOSIS — N3001 Acute cystitis with hematuria: Secondary | ICD-10-CM | POA: Diagnosis not present

## 2023-07-23 DIAGNOSIS — I4891 Unspecified atrial fibrillation: Secondary | ICD-10-CM | POA: Diagnosis not present

## 2023-07-23 DIAGNOSIS — E876 Hypokalemia: Secondary | ICD-10-CM | POA: Diagnosis not present

## 2023-07-23 DIAGNOSIS — E860 Dehydration: Secondary | ICD-10-CM | POA: Diagnosis not present

## 2023-07-25 DIAGNOSIS — I700 Atherosclerosis of aorta: Secondary | ICD-10-CM | POA: Diagnosis not present

## 2023-07-25 DIAGNOSIS — I4891 Unspecified atrial fibrillation: Secondary | ICD-10-CM | POA: Diagnosis not present

## 2023-07-25 DIAGNOSIS — I10 Essential (primary) hypertension: Secondary | ICD-10-CM | POA: Diagnosis not present

## 2023-07-25 DIAGNOSIS — E1165 Type 2 diabetes mellitus with hyperglycemia: Secondary | ICD-10-CM | POA: Diagnosis not present

## 2023-07-25 DIAGNOSIS — F0280 Dementia in other diseases classified elsewhere without behavioral disturbance: Secondary | ICD-10-CM | POA: Diagnosis not present

## 2023-07-25 DIAGNOSIS — E039 Hypothyroidism, unspecified: Secondary | ICD-10-CM | POA: Diagnosis not present

## 2023-07-25 DIAGNOSIS — E876 Hypokalemia: Secondary | ICD-10-CM | POA: Diagnosis not present

## 2023-07-25 DIAGNOSIS — N3001 Acute cystitis with hematuria: Secondary | ICD-10-CM | POA: Diagnosis not present

## 2023-07-25 DIAGNOSIS — E860 Dehydration: Secondary | ICD-10-CM | POA: Diagnosis not present

## 2023-07-27 DIAGNOSIS — I10 Essential (primary) hypertension: Secondary | ICD-10-CM | POA: Diagnosis not present

## 2023-07-27 DIAGNOSIS — I700 Atherosclerosis of aorta: Secondary | ICD-10-CM | POA: Diagnosis not present

## 2023-07-27 DIAGNOSIS — I4891 Unspecified atrial fibrillation: Secondary | ICD-10-CM | POA: Diagnosis not present

## 2023-07-27 DIAGNOSIS — E1165 Type 2 diabetes mellitus with hyperglycemia: Secondary | ICD-10-CM | POA: Diagnosis not present

## 2023-07-27 DIAGNOSIS — N3001 Acute cystitis with hematuria: Secondary | ICD-10-CM | POA: Diagnosis not present

## 2023-07-27 DIAGNOSIS — F0280 Dementia in other diseases classified elsewhere without behavioral disturbance: Secondary | ICD-10-CM | POA: Diagnosis not present

## 2023-07-27 DIAGNOSIS — E876 Hypokalemia: Secondary | ICD-10-CM | POA: Diagnosis not present

## 2023-07-27 DIAGNOSIS — E860 Dehydration: Secondary | ICD-10-CM | POA: Diagnosis not present

## 2023-07-27 DIAGNOSIS — E039 Hypothyroidism, unspecified: Secondary | ICD-10-CM | POA: Diagnosis not present

## 2023-07-31 DIAGNOSIS — E1165 Type 2 diabetes mellitus with hyperglycemia: Secondary | ICD-10-CM | POA: Diagnosis not present

## 2023-07-31 DIAGNOSIS — E860 Dehydration: Secondary | ICD-10-CM | POA: Diagnosis not present

## 2023-07-31 DIAGNOSIS — I700 Atherosclerosis of aorta: Secondary | ICD-10-CM | POA: Diagnosis not present

## 2023-07-31 DIAGNOSIS — E876 Hypokalemia: Secondary | ICD-10-CM | POA: Diagnosis not present

## 2023-07-31 DIAGNOSIS — I4891 Unspecified atrial fibrillation: Secondary | ICD-10-CM | POA: Diagnosis not present

## 2023-07-31 DIAGNOSIS — F0280 Dementia in other diseases classified elsewhere without behavioral disturbance: Secondary | ICD-10-CM | POA: Diagnosis not present

## 2023-07-31 DIAGNOSIS — N3001 Acute cystitis with hematuria: Secondary | ICD-10-CM | POA: Diagnosis not present

## 2023-07-31 DIAGNOSIS — I10 Essential (primary) hypertension: Secondary | ICD-10-CM | POA: Diagnosis not present

## 2023-07-31 DIAGNOSIS — E039 Hypothyroidism, unspecified: Secondary | ICD-10-CM | POA: Diagnosis not present

## 2023-08-01 DIAGNOSIS — R410 Disorientation, unspecified: Secondary | ICD-10-CM | POA: Diagnosis not present

## 2023-08-01 DIAGNOSIS — S59919A Unspecified injury of unspecified forearm, initial encounter: Secondary | ICD-10-CM | POA: Diagnosis not present

## 2023-08-01 DIAGNOSIS — S52321D Displaced transverse fracture of shaft of right radius, subsequent encounter for closed fracture with routine healing: Secondary | ICD-10-CM | POA: Diagnosis not present

## 2023-08-01 DIAGNOSIS — S52591A Other fractures of lower end of right radius, initial encounter for closed fracture: Secondary | ICD-10-CM | POA: Diagnosis not present

## 2023-08-01 DIAGNOSIS — S52611A Displaced fracture of right ulna styloid process, initial encounter for closed fracture: Secondary | ICD-10-CM | POA: Diagnosis not present

## 2023-08-01 DIAGNOSIS — Z753 Unavailability and inaccessibility of health-care facilities: Secondary | ICD-10-CM | POA: Diagnosis not present

## 2023-08-01 DIAGNOSIS — S52611D Displaced fracture of right ulna styloid process, subsequent encounter for closed fracture with routine healing: Secondary | ICD-10-CM | POA: Diagnosis not present

## 2023-08-01 DIAGNOSIS — W19XXXA Unspecified fall, initial encounter: Secondary | ICD-10-CM | POA: Diagnosis not present

## 2023-08-01 DIAGNOSIS — S52501A Unspecified fracture of the lower end of right radius, initial encounter for closed fracture: Secondary | ICD-10-CM | POA: Diagnosis not present

## 2023-08-01 DIAGNOSIS — Z741 Need for assistance with personal care: Secondary | ICD-10-CM | POA: Diagnosis not present

## 2023-08-03 DIAGNOSIS — D649 Anemia, unspecified: Secondary | ICD-10-CM | POA: Diagnosis not present

## 2023-08-03 DIAGNOSIS — E1169 Type 2 diabetes mellitus with other specified complication: Secondary | ICD-10-CM | POA: Diagnosis not present

## 2023-08-03 DIAGNOSIS — S52501A Unspecified fracture of the lower end of right radius, initial encounter for closed fracture: Secondary | ICD-10-CM | POA: Diagnosis not present

## 2023-08-09 DIAGNOSIS — D509 Iron deficiency anemia, unspecified: Secondary | ICD-10-CM | POA: Diagnosis not present

## 2023-08-09 DIAGNOSIS — I4891 Unspecified atrial fibrillation: Secondary | ICD-10-CM | POA: Diagnosis not present

## 2023-08-09 DIAGNOSIS — G2581 Restless legs syndrome: Secondary | ICD-10-CM | POA: Diagnosis not present

## 2023-08-09 DIAGNOSIS — I509 Heart failure, unspecified: Secondary | ICD-10-CM | POA: Diagnosis not present

## 2023-08-09 DIAGNOSIS — I5032 Chronic diastolic (congestive) heart failure: Secondary | ICD-10-CM | POA: Diagnosis not present

## 2023-08-09 DIAGNOSIS — E119 Type 2 diabetes mellitus without complications: Secondary | ICD-10-CM | POA: Diagnosis not present

## 2023-08-09 DIAGNOSIS — R0689 Other abnormalities of breathing: Secondary | ICD-10-CM | POA: Diagnosis not present

## 2023-08-09 DIAGNOSIS — R0902 Hypoxemia: Secondary | ICD-10-CM | POA: Diagnosis not present

## 2023-08-09 DIAGNOSIS — R404 Transient alteration of awareness: Secondary | ICD-10-CM | POA: Diagnosis not present

## 2023-08-09 DIAGNOSIS — R918 Other nonspecific abnormal finding of lung field: Secondary | ICD-10-CM | POA: Diagnosis not present

## 2023-08-09 DIAGNOSIS — F32A Depression, unspecified: Secondary | ICD-10-CM | POA: Diagnosis not present

## 2023-08-09 DIAGNOSIS — S52571A Other intraarticular fracture of lower end of right radius, initial encounter for closed fracture: Secondary | ICD-10-CM | POA: Diagnosis not present

## 2023-08-09 DIAGNOSIS — E038 Other specified hypothyroidism: Secondary | ICD-10-CM | POA: Diagnosis not present

## 2023-08-09 DIAGNOSIS — K922 Gastrointestinal hemorrhage, unspecified: Secondary | ICD-10-CM | POA: Diagnosis not present

## 2023-08-09 DIAGNOSIS — G8918 Other acute postprocedural pain: Secondary | ICD-10-CM | POA: Diagnosis not present

## 2023-08-09 DIAGNOSIS — K449 Diaphragmatic hernia without obstruction or gangrene: Secondary | ICD-10-CM | POA: Diagnosis not present

## 2023-08-09 DIAGNOSIS — R0989 Other specified symptoms and signs involving the circulatory and respiratory systems: Secondary | ICD-10-CM | POA: Diagnosis not present

## 2023-08-09 DIAGNOSIS — I480 Paroxysmal atrial fibrillation: Secondary | ICD-10-CM | POA: Diagnosis not present

## 2023-08-09 DIAGNOSIS — S52501D Unspecified fracture of the lower end of right radius, subsequent encounter for closed fracture with routine healing: Secondary | ICD-10-CM | POA: Diagnosis not present

## 2023-08-09 DIAGNOSIS — I110 Hypertensive heart disease with heart failure: Secondary | ICD-10-CM | POA: Diagnosis not present

## 2023-08-09 DIAGNOSIS — K3189 Other diseases of stomach and duodenum: Secondary | ICD-10-CM | POA: Diagnosis not present

## 2023-08-09 DIAGNOSIS — I10 Essential (primary) hypertension: Secondary | ICD-10-CM | POA: Diagnosis not present

## 2023-08-09 DIAGNOSIS — Z743 Need for continuous supervision: Secondary | ICD-10-CM | POA: Diagnosis not present

## 2023-08-09 DIAGNOSIS — S52501A Unspecified fracture of the lower end of right radius, initial encounter for closed fracture: Secondary | ICD-10-CM | POA: Diagnosis not present

## 2023-08-09 DIAGNOSIS — K921 Melena: Secondary | ICD-10-CM | POA: Diagnosis not present

## 2023-08-09 DIAGNOSIS — D62 Acute posthemorrhagic anemia: Secondary | ICD-10-CM | POA: Diagnosis not present

## 2023-08-09 DIAGNOSIS — F0390 Unspecified dementia without behavioral disturbance: Secondary | ICD-10-CM | POA: Diagnosis not present

## 2023-08-09 DIAGNOSIS — D649 Anemia, unspecified: Secondary | ICD-10-CM | POA: Diagnosis not present

## 2023-08-09 DIAGNOSIS — I517 Cardiomegaly: Secondary | ICD-10-CM | POA: Diagnosis not present

## 2023-08-09 DIAGNOSIS — E039 Hypothyroidism, unspecified: Secondary | ICD-10-CM | POA: Diagnosis not present

## 2023-08-09 DIAGNOSIS — R9431 Abnormal electrocardiogram [ECG] [EKG]: Secondary | ICD-10-CM | POA: Diagnosis not present

## 2023-08-10 DIAGNOSIS — K3189 Other diseases of stomach and duodenum: Secondary | ICD-10-CM | POA: Diagnosis not present

## 2023-08-10 DIAGNOSIS — K921 Melena: Secondary | ICD-10-CM | POA: Diagnosis not present

## 2023-08-10 DIAGNOSIS — D649 Anemia, unspecified: Secondary | ICD-10-CM | POA: Diagnosis not present

## 2023-08-10 DIAGNOSIS — D62 Acute posthemorrhagic anemia: Secondary | ICD-10-CM | POA: Diagnosis not present

## 2023-08-10 DIAGNOSIS — K922 Gastrointestinal hemorrhage, unspecified: Secondary | ICD-10-CM | POA: Diagnosis not present

## 2023-08-10 DIAGNOSIS — K449 Diaphragmatic hernia without obstruction or gangrene: Secondary | ICD-10-CM | POA: Diagnosis not present

## 2023-08-12 DIAGNOSIS — I501 Left ventricular failure: Secondary | ICD-10-CM | POA: Diagnosis not present

## 2023-08-12 DIAGNOSIS — I10 Essential (primary) hypertension: Secondary | ICD-10-CM | POA: Diagnosis not present

## 2023-08-12 DIAGNOSIS — D649 Anemia, unspecified: Secondary | ICD-10-CM | POA: Diagnosis not present

## 2023-08-12 DIAGNOSIS — E119 Type 2 diabetes mellitus without complications: Secondary | ICD-10-CM | POA: Diagnosis not present

## 2023-08-12 DIAGNOSIS — I4891 Unspecified atrial fibrillation: Secondary | ICD-10-CM | POA: Diagnosis not present

## 2023-08-12 DIAGNOSIS — S52501D Unspecified fracture of the lower end of right radius, subsequent encounter for closed fracture with routine healing: Secondary | ICD-10-CM | POA: Diagnosis not present

## 2023-08-12 DIAGNOSIS — Z743 Need for continuous supervision: Secondary | ICD-10-CM | POA: Diagnosis not present

## 2023-08-12 DIAGNOSIS — R404 Transient alteration of awareness: Secondary | ICD-10-CM | POA: Diagnosis not present

## 2023-08-12 DIAGNOSIS — R0902 Hypoxemia: Secondary | ICD-10-CM | POA: Diagnosis not present

## 2023-08-12 DIAGNOSIS — E039 Hypothyroidism, unspecified: Secondary | ICD-10-CM | POA: Diagnosis not present

## 2023-08-12 DIAGNOSIS — F32A Depression, unspecified: Secondary | ICD-10-CM | POA: Diagnosis not present

## 2023-08-12 DIAGNOSIS — K219 Gastro-esophageal reflux disease without esophagitis: Secondary | ICD-10-CM | POA: Diagnosis not present

## 2023-08-12 DIAGNOSIS — I4819 Other persistent atrial fibrillation: Secondary | ICD-10-CM | POA: Diagnosis not present

## 2023-08-12 DIAGNOSIS — R0689 Other abnormalities of breathing: Secondary | ICD-10-CM | POA: Diagnosis not present

## 2023-08-12 DIAGNOSIS — I509 Heart failure, unspecified: Secondary | ICD-10-CM | POA: Diagnosis not present

## 2023-08-12 DIAGNOSIS — F0390 Unspecified dementia without behavioral disturbance: Secondary | ICD-10-CM | POA: Diagnosis not present

## 2023-08-12 DIAGNOSIS — F419 Anxiety disorder, unspecified: Secondary | ICD-10-CM | POA: Diagnosis not present

## 2023-08-12 DIAGNOSIS — S52501A Unspecified fracture of the lower end of right radius, initial encounter for closed fracture: Secondary | ICD-10-CM | POA: Diagnosis not present

## 2023-08-13 DIAGNOSIS — E039 Hypothyroidism, unspecified: Secondary | ICD-10-CM | POA: Diagnosis not present

## 2023-08-13 DIAGNOSIS — E119 Type 2 diabetes mellitus without complications: Secondary | ICD-10-CM | POA: Diagnosis not present

## 2023-08-13 DIAGNOSIS — F32A Depression, unspecified: Secondary | ICD-10-CM | POA: Diagnosis not present

## 2023-08-13 DIAGNOSIS — S52501D Unspecified fracture of the lower end of right radius, subsequent encounter for closed fracture with routine healing: Secondary | ICD-10-CM | POA: Diagnosis not present

## 2023-08-15 DIAGNOSIS — E119 Type 2 diabetes mellitus without complications: Secondary | ICD-10-CM | POA: Diagnosis not present

## 2023-08-15 DIAGNOSIS — F0390 Unspecified dementia without behavioral disturbance: Secondary | ICD-10-CM | POA: Diagnosis not present

## 2023-08-15 DIAGNOSIS — F419 Anxiety disorder, unspecified: Secondary | ICD-10-CM | POA: Diagnosis not present

## 2023-08-15 DIAGNOSIS — F32A Depression, unspecified: Secondary | ICD-10-CM | POA: Diagnosis not present

## 2023-08-22 DIAGNOSIS — S52501A Unspecified fracture of the lower end of right radius, initial encounter for closed fracture: Secondary | ICD-10-CM | POA: Diagnosis not present

## 2023-08-22 DIAGNOSIS — S52501D Unspecified fracture of the lower end of right radius, subsequent encounter for closed fracture with routine healing: Secondary | ICD-10-CM | POA: Diagnosis not present

## 2023-08-23 DIAGNOSIS — S52501D Unspecified fracture of the lower end of right radius, subsequent encounter for closed fracture with routine healing: Secondary | ICD-10-CM | POA: Diagnosis not present

## 2023-08-30 DIAGNOSIS — F32A Depression, unspecified: Secondary | ICD-10-CM | POA: Diagnosis not present

## 2023-08-30 DIAGNOSIS — E039 Hypothyroidism, unspecified: Secondary | ICD-10-CM | POA: Diagnosis not present

## 2023-08-30 DIAGNOSIS — S52501D Unspecified fracture of the lower end of right radius, subsequent encounter for closed fracture with routine healing: Secondary | ICD-10-CM | POA: Diagnosis not present

## 2023-08-30 DIAGNOSIS — E119 Type 2 diabetes mellitus without complications: Secondary | ICD-10-CM | POA: Diagnosis not present

## 2023-10-05 DIAGNOSIS — S52501D Unspecified fracture of the lower end of right radius, subsequent encounter for closed fracture with routine healing: Secondary | ICD-10-CM | POA: Diagnosis not present

## 2023-11-28 DIAGNOSIS — D509 Iron deficiency anemia, unspecified: Secondary | ICD-10-CM | POA: Diagnosis not present

## 2023-12-31 DIAGNOSIS — K9049 Malabsorption due to intolerance, not elsewhere classified: Secondary | ICD-10-CM | POA: Diagnosis not present

## 2023-12-31 DIAGNOSIS — E119 Type 2 diabetes mellitus without complications: Secondary | ICD-10-CM | POA: Diagnosis not present

## 2023-12-31 DIAGNOSIS — I4891 Unspecified atrial fibrillation: Secondary | ICD-10-CM | POA: Diagnosis not present

## 2023-12-31 DIAGNOSIS — E039 Hypothyroidism, unspecified: Secondary | ICD-10-CM | POA: Diagnosis not present

## 2024-01-10 DIAGNOSIS — E119 Type 2 diabetes mellitus without complications: Secondary | ICD-10-CM | POA: Diagnosis not present

## 2024-01-10 DIAGNOSIS — E109 Type 1 diabetes mellitus without complications: Secondary | ICD-10-CM | POA: Diagnosis not present

## 2024-02-07 DIAGNOSIS — E109 Type 1 diabetes mellitus without complications: Secondary | ICD-10-CM | POA: Diagnosis not present

## 2024-03-09 DIAGNOSIS — E109 Type 1 diabetes mellitus without complications: Secondary | ICD-10-CM | POA: Diagnosis not present

## 2024-03-31 DIAGNOSIS — E119 Type 2 diabetes mellitus without complications: Secondary | ICD-10-CM | POA: Diagnosis not present

## 2024-03-31 DIAGNOSIS — W19XXXA Unspecified fall, initial encounter: Secondary | ICD-10-CM | POA: Diagnosis not present

## 2024-03-31 DIAGNOSIS — G9341 Metabolic encephalopathy: Secondary | ICD-10-CM | POA: Diagnosis not present

## 2024-03-31 DIAGNOSIS — R5381 Other malaise: Secondary | ICD-10-CM | POA: Diagnosis not present

## 2024-03-31 DIAGNOSIS — R0902 Hypoxemia: Secondary | ICD-10-CM | POA: Diagnosis not present

## 2024-03-31 DIAGNOSIS — M1990 Unspecified osteoarthritis, unspecified site: Secondary | ICD-10-CM | POA: Diagnosis not present

## 2024-03-31 DIAGNOSIS — Z7901 Long term (current) use of anticoagulants: Secondary | ICD-10-CM | POA: Diagnosis not present

## 2024-03-31 DIAGNOSIS — R627 Adult failure to thrive: Secondary | ICD-10-CM | POA: Diagnosis not present

## 2024-03-31 DIAGNOSIS — Z8673 Personal history of transient ischemic attack (TIA), and cerebral infarction without residual deficits: Secondary | ICD-10-CM | POA: Diagnosis not present

## 2024-03-31 DIAGNOSIS — E039 Hypothyroidism, unspecified: Secondary | ICD-10-CM | POA: Diagnosis not present

## 2024-03-31 DIAGNOSIS — Z79899 Other long term (current) drug therapy: Secondary | ICD-10-CM | POA: Diagnosis not present

## 2024-03-31 DIAGNOSIS — Z7984 Long term (current) use of oral hypoglycemic drugs: Secondary | ICD-10-CM | POA: Diagnosis not present

## 2024-03-31 DIAGNOSIS — R531 Weakness: Secondary | ICD-10-CM | POA: Diagnosis not present

## 2024-03-31 DIAGNOSIS — R739 Hyperglycemia, unspecified: Secondary | ICD-10-CM | POA: Diagnosis not present

## 2024-03-31 DIAGNOSIS — E876 Hypokalemia: Secondary | ICD-10-CM | POA: Diagnosis not present

## 2024-03-31 DIAGNOSIS — I4891 Unspecified atrial fibrillation: Secondary | ICD-10-CM | POA: Diagnosis not present

## 2024-03-31 DIAGNOSIS — R9431 Abnormal electrocardiogram [ECG] [EKG]: Secondary | ICD-10-CM | POA: Diagnosis not present

## 2024-03-31 DIAGNOSIS — N390 Urinary tract infection, site not specified: Secondary | ICD-10-CM | POA: Diagnosis not present

## 2024-03-31 DIAGNOSIS — E7800 Pure hypercholesterolemia, unspecified: Secondary | ICD-10-CM | POA: Diagnosis not present

## 2024-03-31 DIAGNOSIS — R55 Syncope and collapse: Secondary | ICD-10-CM | POA: Diagnosis not present

## 2024-03-31 DIAGNOSIS — Z87440 Personal history of urinary (tract) infections: Secondary | ICD-10-CM | POA: Diagnosis not present

## 2024-03-31 DIAGNOSIS — Z681 Body mass index (BMI) 19 or less, adult: Secondary | ICD-10-CM | POA: Diagnosis not present

## 2024-03-31 DIAGNOSIS — Z66 Do not resuscitate: Secondary | ICD-10-CM | POA: Diagnosis not present

## 2024-03-31 DIAGNOSIS — G301 Alzheimer's disease with late onset: Secondary | ICD-10-CM | POA: Diagnosis not present

## 2024-03-31 DIAGNOSIS — I10 Essential (primary) hypertension: Secondary | ICD-10-CM | POA: Diagnosis not present

## 2024-04-01 DIAGNOSIS — G9341 Metabolic encephalopathy: Secondary | ICD-10-CM | POA: Diagnosis not present

## 2024-04-01 DIAGNOSIS — N390 Urinary tract infection, site not specified: Secondary | ICD-10-CM | POA: Diagnosis not present

## 2024-04-02 DIAGNOSIS — R1311 Dysphagia, oral phase: Secondary | ICD-10-CM | POA: Diagnosis not present

## 2024-04-02 DIAGNOSIS — R531 Weakness: Secondary | ICD-10-CM | POA: Diagnosis not present

## 2024-04-02 DIAGNOSIS — N390 Urinary tract infection, site not specified: Secondary | ICD-10-CM | POA: Diagnosis not present

## 2024-04-02 DIAGNOSIS — R5381 Other malaise: Secondary | ICD-10-CM | POA: Diagnosis not present

## 2024-04-02 DIAGNOSIS — M810 Age-related osteoporosis without current pathological fracture: Secondary | ICD-10-CM | POA: Diagnosis not present

## 2024-04-02 DIAGNOSIS — E876 Hypokalemia: Secondary | ICD-10-CM | POA: Diagnosis not present

## 2024-04-02 DIAGNOSIS — G9341 Metabolic encephalopathy: Secondary | ICD-10-CM | POA: Diagnosis not present

## 2024-04-02 DIAGNOSIS — Z7409 Other reduced mobility: Secondary | ICD-10-CM | POA: Diagnosis not present

## 2024-04-02 DIAGNOSIS — K219 Gastro-esophageal reflux disease without esophagitis: Secondary | ICD-10-CM | POA: Diagnosis not present

## 2024-04-02 DIAGNOSIS — R262 Difficulty in walking, not elsewhere classified: Secondary | ICD-10-CM | POA: Diagnosis not present

## 2024-04-02 DIAGNOSIS — R0902 Hypoxemia: Secondary | ICD-10-CM | POA: Diagnosis not present

## 2024-04-02 DIAGNOSIS — R627 Adult failure to thrive: Secondary | ICD-10-CM | POA: Diagnosis not present

## 2024-04-02 DIAGNOSIS — M1990 Unspecified osteoarthritis, unspecified site: Secondary | ICD-10-CM | POA: Diagnosis not present

## 2024-04-02 DIAGNOSIS — Z7901 Long term (current) use of anticoagulants: Secondary | ICD-10-CM | POA: Diagnosis not present

## 2024-04-02 DIAGNOSIS — G309 Alzheimer's disease, unspecified: Secondary | ICD-10-CM | POA: Diagnosis not present

## 2024-04-02 DIAGNOSIS — I10 Essential (primary) hypertension: Secondary | ICD-10-CM | POA: Diagnosis not present

## 2024-04-02 DIAGNOSIS — E039 Hypothyroidism, unspecified: Secondary | ICD-10-CM | POA: Diagnosis not present

## 2024-04-02 DIAGNOSIS — M6259 Muscle wasting and atrophy, not elsewhere classified, multiple sites: Secondary | ICD-10-CM | POA: Diagnosis not present

## 2024-04-02 DIAGNOSIS — Z7984 Long term (current) use of oral hypoglycemic drugs: Secondary | ICD-10-CM | POA: Diagnosis not present

## 2024-04-02 DIAGNOSIS — Z8673 Personal history of transient ischemic attack (TIA), and cerebral infarction without residual deficits: Secondary | ICD-10-CM | POA: Diagnosis not present

## 2024-04-02 DIAGNOSIS — E119 Type 2 diabetes mellitus without complications: Secondary | ICD-10-CM | POA: Diagnosis not present

## 2024-04-02 DIAGNOSIS — Z79899 Other long term (current) drug therapy: Secondary | ICD-10-CM | POA: Diagnosis not present

## 2024-04-02 DIAGNOSIS — Z87440 Personal history of urinary (tract) infections: Secondary | ICD-10-CM | POA: Diagnosis not present

## 2024-04-02 DIAGNOSIS — E038 Other specified hypothyroidism: Secondary | ICD-10-CM | POA: Diagnosis not present

## 2024-04-02 DIAGNOSIS — Z681 Body mass index (BMI) 19 or less, adult: Secondary | ICD-10-CM | POA: Diagnosis not present

## 2024-04-02 DIAGNOSIS — E785 Hyperlipidemia, unspecified: Secondary | ICD-10-CM | POA: Diagnosis not present

## 2024-04-02 DIAGNOSIS — Z66 Do not resuscitate: Secondary | ICD-10-CM | POA: Diagnosis not present

## 2024-04-02 DIAGNOSIS — I4891 Unspecified atrial fibrillation: Secondary | ICD-10-CM | POA: Diagnosis not present

## 2024-04-02 DIAGNOSIS — E7800 Pure hypercholesterolemia, unspecified: Secondary | ICD-10-CM | POA: Diagnosis not present

## 2024-04-02 DIAGNOSIS — G301 Alzheimer's disease with late onset: Secondary | ICD-10-CM | POA: Diagnosis not present

## 2024-04-03 DIAGNOSIS — R627 Adult failure to thrive: Secondary | ICD-10-CM | POA: Diagnosis not present

## 2024-04-03 DIAGNOSIS — I4891 Unspecified atrial fibrillation: Secondary | ICD-10-CM | POA: Diagnosis not present

## 2024-04-03 DIAGNOSIS — E119 Type 2 diabetes mellitus without complications: Secondary | ICD-10-CM | POA: Diagnosis not present

## 2024-04-09 DIAGNOSIS — R627 Adult failure to thrive: Secondary | ICD-10-CM | POA: Diagnosis not present

## 2024-04-09 DIAGNOSIS — E119 Type 2 diabetes mellitus without complications: Secondary | ICD-10-CM | POA: Diagnosis not present

## 2024-04-09 DIAGNOSIS — I4891 Unspecified atrial fibrillation: Secondary | ICD-10-CM | POA: Diagnosis not present

## 2024-04-10 DIAGNOSIS — R627 Adult failure to thrive: Secondary | ICD-10-CM | POA: Diagnosis not present

## 2024-04-10 DIAGNOSIS — G309 Alzheimer's disease, unspecified: Secondary | ICD-10-CM | POA: Diagnosis not present

## 2024-05-08 DIAGNOSIS — G309 Alzheimer's disease, unspecified: Secondary | ICD-10-CM | POA: Diagnosis not present
